# Patient Record
Sex: Male | Born: 1940 | Race: White | Hispanic: No | Marital: Single | Smoking: Former smoker
Health system: Southern US, Community
[De-identification: ages and names within clinical notes are randomized; demographics above are authoritative.]

## PROBLEM LIST (undated history)

## (undated) DIAGNOSIS — F99 Mental disorder, not otherwise specified: Secondary | ICD-10-CM

## (undated) DIAGNOSIS — K759 Inflammatory liver disease, unspecified: Secondary | ICD-10-CM

## (undated) DIAGNOSIS — I2510 Atherosclerotic heart disease of native coronary artery without angina pectoris: Secondary | ICD-10-CM

## (undated) DIAGNOSIS — F4310 Post-traumatic stress disorder, unspecified: Secondary | ICD-10-CM

## (undated) DIAGNOSIS — I639 Cerebral infarction, unspecified: Secondary | ICD-10-CM

## (undated) HISTORY — PX: TONSILLECTOMY: SUR1361

## (undated) HISTORY — PX: JOINT REPLACEMENT: SHX530

## (undated) HISTORY — DX: Atherosclerotic heart disease of native coronary artery without angina pectoris: I25.10

## (undated) HISTORY — DX: Cerebral infarction, unspecified: I63.9

## (undated) HISTORY — PX: KNEE SURGERY: SHX244

## (undated) HISTORY — PX: APPENDECTOMY: SHX54

---

## 2008-07-23 ENCOUNTER — Encounter: Admission: RE | Admit: 2008-07-23 | Discharge: 2008-07-23 | Payer: Self-pay | Admitting: Gastroenterology

## 2010-06-07 ENCOUNTER — Encounter: Admission: RE | Admit: 2010-06-07 | Discharge: 2010-06-07 | Payer: Self-pay | Admitting: Family Medicine

## 2011-05-12 NOTE — H&P (Signed)
NAME:  [name] NO.:  [id number]  MEDICAL RECORD NO.:  [id number]  LOCATION:  1S                           FACILITY:  [facility]  PHYSICIAN:  Madlyn Frankel. Charlann Boxer, M.D.  DATE OF BIRTH:  20-Jan-1941  DATE OF ADMISSION:  04/23/2011 DATE OF DISCHARGE:                             HISTORY & PHYSICAL   DATE OF SURGERY:  May 21, 2011  IMPRESSION:  Bilateral knee osteoarthritis.  HISTORY OF PRESENT ILLNESS:  The patient is a 70 year old white gentleman who was complaining of bilateral knee pain since 1996.  The patient states it has just been increasing over time, started off minimally, treated conservatively, was fine over the years. Conservative treatments including steroid injections and hyaluronic acid injections have worked less than less and the patient feels it is time to have a more definitive treatment.  X-rays in the clinic showed bilateral knee osteoarthritis, significant with bone-on-bone changes. Options were discussed with the patient.  The patient wishes to proceed with surgery.  Risks, benefits, and expectations of the procedure were discussed with the patient.  The patient understands the risks, benefits, and expectations, and wishes to proceed with surgery. Questions were invited and answered.  The patient is a candidate for tranexamic acid and will be given this perioperatively.  The patient has not been given his postop medications which will have to be given before discharge.  PAST MEDICAL HISTORY:  The patient's primary care physician is Dr. Allena Katz, at [facility] who has cleared the patient for surgery. Medical problems include : 1. Hepatitis. 2. Liver disease. 3. Degenerative disk disease. 4. Dentures. 5. Impaired vision and he wears glasses.  PAST SURGICAL HISTORY: 1. Hernia repair. 2. Tonsillectomy and adenoidectomy. 3. Appendectomy.  MEDICATIONS: 1. Klonopin 1 mg 1-1/2 tablet p.o. q.h.s. 2. Dulcolax q.h.s. 3. Ranitidine 150 mg 1 p.o.  daily.  ALLERGIES:  Iodine causes anaphylaxis.  The patient also states that he may be allergic to penicillin but he is not sure.  SOCIAL HISTORY:  The patient denies use of alcohol or tobacco.  REVIEW OF SYSTEMS:  The patient admits to frequent urination at night, back pain, morning stiffness and joint swelling, other than that unremarkable.  PHYSICAL EXAMINATION:  GENERAL:  The patient is a 70 year old white male, in no acute distress. VITAL SIGNS:  Stable.  Blood pressure is 138/84, respirations 16, pulse 80. HEENT:  Pupils are equal, round, and reactive to light and accommodation.  Throat is clear. NECK:  Supple.  No JVD.  No carotid bruits.  No lymphadenopathy. CARDIO:  Normal appearing S1 and S2.  No murmur appreciated. RESPIRATORY:  Lungs clear to auscultation bilaterally. NEUROLOGIC:  The patient is oriented x3. ORTHO:  Pertaining to the bilateral knees.  The patient does have slight tenderness to palpation of the anterior tibial surfaces of bilateral knees.  The patient does have significant crepitus.  With range of motion, the patient does have good extension and flexion of both knees. Surgical site was clear.  The patient has a +2 dorsalis pedis pulse, both legs.  The patient has good sensation distally, both legs.  IMPRESSION:  Bilateral knee osteoarthritis.  PLAN:  The patient will be admitted to  the hospital and undergo bilateral total knee replacements by Dr. Charlann Boxer on May 21, 2011. Risks, benefits and expectations of procedure were discussed with the patient.  The patient understands the risks, benefits, and expectations, and wishes to proceed with surgery.    ______________________________ Lanney Gins, PA   ______________________________ Madlyn Frankel. Charlann Boxer, M.D.    MB/MEDQ  D:  05/09/2011  T:  05/10/2011  Job:  [id number]  Electronically Signed by Lanney Gins PA on 05/10/2011 03:58:44 PM Electronically Signed by Durene Romans M.D. on 05/12/2011  09:21:55 AM

## 2011-05-14 ENCOUNTER — Other Ambulatory Visit: Payer: Self-pay | Admitting: Orthopedic Surgery

## 2011-05-14 ENCOUNTER — Encounter (HOSPITAL_COMMUNITY): Payer: Medicare PPO

## 2011-05-14 LAB — COMPREHENSIVE METABOLIC PANEL
AST: 21 U/L (ref 0–37)
Albumin: 4.3 g/dL (ref 3.5–5.2)
Alkaline Phosphatase: 41 U/L (ref 39–117)
BUN: 13 mg/dL (ref 6–23)
GFR calc non Af Amer: >60 mL/min (ref >60)
Sodium: 136 mEq/L (ref 135–145)
Total Protein: 7.6 g/dL (ref 6.0–8.3)

## 2011-05-14 LAB — DIFFERENTIAL
Basophils Absolute: 0 K/uL (ref 0.0–0.1)
Basophils Relative: 1 % (ref 0–1)
Eosinophils Absolute: 0.3 K/uL (ref 0.0–0.7)
Eosinophils Relative: 5 % (ref 0–5)
Lymphocytes Relative: 34 % (ref 12–46)
Lymphs Abs: 2.2 K/uL (ref 0.7–4.0)
Monocytes Absolute: 0.6 K/uL (ref 0.1–1.0)
Monocytes Relative: 9 % (ref 3–12)
Neutro Abs: 3.3 K/uL (ref 1.7–7.7)
Neutrophils Relative %: 52 % (ref 43–77)

## 2011-05-14 LAB — PROTIME-INR
INR: 0.96 (ref 0.00–1.49)
Prothrombin Time: 13 seconds (ref 11.6–15.2)

## 2011-05-14 LAB — URINALYSIS, ROUTINE W REFLEX MICROSCOPIC
Bilirubin Urine: NEGATIVE
Glucose, UA: NEGATIVE mg/dL
Hgb urine dipstick: NEGATIVE
Ketones, ur: NEGATIVE mg/dL
Leukocytes, UA: NEGATIVE
Nitrite: NEGATIVE
Protein, ur: NEGATIVE mg/dL
Specific Gravity, Urine: 1.013 (ref 1.005–1.030)
Urobilinogen, UA: 0.2 mg/dL (ref 0.0–1.0)
pH: 7 (ref 5.0–8.0)

## 2011-05-14 LAB — CBC
MCV: 92.7 fL (ref 78.0–100.0)
Platelets: 235 10*3/uL (ref 150–400)
RDW: 12.7 % (ref 11.5–15.5)
WBC: 6.4 10*3/uL (ref 4.0–10.5)

## 2011-05-14 LAB — SURGICAL PCR SCREEN
MRSA, PCR: NEGATIVE
Staphylococcus aureus: POSITIVE — AB

## 2011-05-14 LAB — APTT: aPTT: 32 seconds (ref 24–37)

## 2011-05-21 ENCOUNTER — Ambulatory Visit (HOSPITAL_COMMUNITY)
Admission: RE | Admit: 2011-05-21 | Discharge: 2011-05-21 | Disposition: A | Discharge status: Home / Self Care | Payer: Medicare PPO | Source: Ambulatory Visit | Attending: Orthopedic Surgery | Admitting: Orthopedic Surgery

## 2011-05-21 DIAGNOSIS — IMO0002 Reserved for concepts with insufficient information to code with codable children: Secondary | ICD-10-CM | POA: Insufficient documentation

## 2011-05-21 DIAGNOSIS — B1920 Unspecified viral hepatitis C without hepatic coma: Secondary | ICD-10-CM | POA: Insufficient documentation

## 2011-05-21 DIAGNOSIS — M1710 Unilateral primary osteoarthritis, unspecified knee: Secondary | ICD-10-CM | POA: Insufficient documentation

## 2011-05-21 DIAGNOSIS — K7689 Other specified diseases of liver: Secondary | ICD-10-CM | POA: Insufficient documentation

## 2011-05-21 DIAGNOSIS — Z79899 Other long term (current) drug therapy: Secondary | ICD-10-CM | POA: Insufficient documentation

## 2011-07-04 ENCOUNTER — Other Ambulatory Visit: Payer: Self-pay | Admitting: Orthopedic Surgery

## 2011-07-04 ENCOUNTER — Encounter (HOSPITAL_COMMUNITY): Payer: Medicare PPO

## 2011-07-04 LAB — CBC
HCT: 44 % (ref 39.0–52.0)
MCH: 33.3 pg (ref 26.0–34.0)
MCHC: 35.5 g/dL (ref 30.0–36.0)
MCV: 93.8 fL (ref 78.0–100.0)
Platelets: 235 10*3/uL (ref 150–400)
RBC: 4.69 MIL/uL (ref 4.22–5.81)
WBC: 6.3 10*3/uL (ref 4.0–10.5)

## 2011-07-04 LAB — URINALYSIS, ROUTINE W REFLEX MICROSCOPIC
Leukocytes, UA: NEGATIVE
Specific Gravity, Urine: 1.005 (ref 1.005–1.030)

## 2011-07-04 LAB — DIFFERENTIAL
Basophils Absolute: 0 10*3/uL (ref 0.0–0.1)
Basophils Relative: 0 % (ref 0–1)
Eosinophils Relative: 2 % (ref 0–5)
Monocytes Relative: 9 % (ref 3–12)

## 2011-07-04 LAB — PROTIME-INR
INR: 0.95 (ref 0.00–1.49)
Prothrombin Time: 12.9 seconds (ref 11.6–15.2)

## 2011-07-04 LAB — COMPREHENSIVE METABOLIC PANEL
AST: 22 U/L (ref 0–37)
Calcium: 9.8 mg/dL (ref 8.4–10.5)
Chloride: 102 mEq/L (ref 96–112)
GFR calc Af Amer: >60 mL/min (ref >60)
Total Bilirubin: 0.3 mg/dL (ref 0.3–1.2)

## 2011-07-04 LAB — APTT: aPTT: 33 seconds (ref 24–37)

## 2011-07-07 LAB — MRSA CULTURE

## 2011-07-09 ENCOUNTER — Inpatient Hospital Stay (HOSPITAL_COMMUNITY)
Admission: RE | Admit: 2011-07-09 | Discharge: 2011-07-12 | DRG: 462 | Disposition: A | Discharge status: Skilled Nursing Facility | Payer: Medicare PPO | Source: Ambulatory Visit | Attending: Orthopedic Surgery | Admitting: Orthopedic Surgery

## 2011-07-09 DIAGNOSIS — Z880 Allergy status to penicillin: Secondary | ICD-10-CM

## 2011-07-09 DIAGNOSIS — Z79899 Other long term (current) drug therapy: Secondary | ICD-10-CM

## 2011-07-09 DIAGNOSIS — Z01812 Encounter for preprocedural laboratory examination: Secondary | ICD-10-CM

## 2011-07-09 DIAGNOSIS — IMO0002 Reserved for concepts with insufficient information to code with codable children: Secondary | ICD-10-CM | POA: Diagnosis present

## 2011-07-09 DIAGNOSIS — Z87891 Personal history of nicotine dependence: Secondary | ICD-10-CM

## 2011-07-09 DIAGNOSIS — H547 Unspecified visual loss: Secondary | ICD-10-CM | POA: Diagnosis present

## 2011-07-09 DIAGNOSIS — Z8619 Personal history of other infectious and parasitic diseases: Secondary | ICD-10-CM

## 2011-07-09 DIAGNOSIS — M1710 Unilateral primary osteoarthritis, unspecified knee: Principal | ICD-10-CM | POA: Diagnosis present

## 2011-07-09 LAB — TYPE AND SCREEN: Antibody Screen: NEGATIVE

## 2011-07-09 NOTE — H&P (Addendum)
NAMEVERDIS, KOVAL NO.:  [id number]  MEDICAL RECORD NO.:  [id number]  LOCATION:                               FACILITY:  [facility]  PHYSICIAN:  Madlyn Frankel. Charlann Boxer, M.D.  DATE OF BIRTH:  Nov 03, 1940  DATE OF ADMISSION:  07/09/2011 DATE OF DISCHARGE:                             HISTORY & PHYSICAL   DATE OF SURGERY:  07/09/2011  DIAGNOSIS:  Bilateral knee pain/osteoarthritis.  HISTORY OF PRESENT ILLNESS:  The patient is a 70 year old white male in no acute distress.  The patient does state he has had bilateral knee pain since 1996 has been increasing since that time.  The patient has had steroid injections and hyaluronic acid injections both of which helped at first.  They became less and less effective over the years until as of late they have been noneffective.  Bilateral knee x-rays do show significant osteoarthritic changes both knees.  Questions were invited and answered.  Options were discussed.  Risks, benefits, and expectations of procedure discussed with the patient.  The patient stands risks, benefits, and expectations and wishes to proceed with bilateral total knee arthroplasties per Dr. Charlann Boxer.  Once being discharged from the hospital, the patient is planning going to a rehab facility.  The patient has not been given his postoperative medications.  The patient is a candidate for tranexamic acid and will be given this prior to surgery.  PAST MEDICAL HISTORY: 1. Liver disease. 2. Hepatitis. 3. Degenerative disk disease. 4. Impaired vision with glasses. 5. Dentures.  PAST SURGICAL HISTORY: 1. Hernia repair. 2. T and A. 3. Appendectomy.  MEDICATIONS: 1. Klonopin 1 mg 1-1/2 tablets q.h.s. 2. Dulcolax 2 p.o. q.h.s. 3. Omeprazole, unknown dose one p.o. q.h.s.Marland Kitchen  ALLERGIES: 1. IODINE causes anaphylaxis reaction. 2. PENICILLIN, unknown reaction.  PRIMARY CARE PHYSICIAN:  Dr. Allena Katz, who has cleared the patient for surgery.  SOCIAL HISTORY:  The  patient admits to smoking in the past, but not at this current time.  The patient denies use of alcohol.  REVIEW OF SYSTEMS:  The patient complains of bilateral knee pain, otherwise unremarkable.  PHYSICAL EXAMINATION:  GENERAL:  The patient is a 70 year old white male in no acute distress. VITAL SIGNS:  Stable.  Blood pressure in the left arm is 132/80, pulse 80, respirations 16. HEENT:  Pupils are equal, round, reactive to light and accommodation. Throat is clear. NECK:  Supple.  No JVD.  No carotid bruits noted.  No lymphadenopathy noted. CARDIAC:  Normal S1 and S2.  No murmur appreciated. RESPIRATORY:  Lungs are clear to auscultation bilaterally. NEURO:  The patient is alert and oriented x3. ORTHO:  Pertaining to bilateral knees.  The patient has no pain on palpation of the entirety of both knees.  The patient does have significant retropatellar crepitus bilaterally.  The patient has good range of motion, but this does cause him pain with whole range of motion and no significant crepitus.  Also, the patient is distally and neurovascularly intact bilaterally as opposed to dorsalis pedis pulse bilaterally.  Surgical sites in both knees are both clear.  STUDIES:  X-rays as above.  IMPRESSION:  Bilateral knee osteoarthritis.  PLAN:  The patient  is admitted to the hospital to undergo bilateral total knee arthroplasties per Dr. Charlann Boxer at [facility] on July 09, 2011.  Risks, benefits, and expectations of the procedure were discussed with the patient.  The patient understands the risks, benefits, and expectations and wished to proceed with surgery.    ______________________________ Lanney Gins, PA   ______________________________ Madlyn Frankel. Charlann Boxer, M.D.    MB/MEDQ  D:  07/04/2011  T:  07/05/2011  Job:  [id number]  Electronically Signed by Lanney Gins PA on 07/07/2011 02:43:42 AM Electronically Signed by Durene Romans M.D. on 07/09/2011 09:04:52 AM

## 2011-07-10 LAB — CBC
MCV: 92.8 fL (ref 78.0–100.0)
Platelets: 180 10*3/uL (ref 150–400)
RBC: 3.62 MIL/uL — ABNORMAL LOW (ref 4.22–5.81)
RDW: 13.2 % (ref 11.5–15.5)
WBC: 6.3 10*3/uL (ref 4.0–10.5)

## 2011-07-10 LAB — BASIC METABOLIC PANEL
CO2: 27 mEq/L (ref 19–32)
Creatinine, Ser: 0.71 mg/dL (ref 0.50–1.35)
GFR calc Af Amer: >90 mL/min (ref >90)
GFR calc non Af Amer: >90 mL/min (ref >90)
Potassium: 4.4 mEq/L (ref 3.5–5.1)
Sodium: 133 mEq/L — ABNORMAL LOW (ref 135–145)

## 2011-07-11 LAB — CBC
HCT: 31 % — ABNORMAL LOW (ref 39.0–52.0)
MCH: 33.1 pg (ref 26.0–34.0)
MCHC: 35.5 g/dL (ref 30.0–36.0)
MCV: 93.4 fL (ref 78.0–100.0)
RBC: 3.32 MIL/uL — ABNORMAL LOW (ref 4.22–5.81)
RDW: 13.2 % (ref 11.5–15.5)
WBC: 6 10*3/uL (ref 4.0–10.5)

## 2011-07-11 LAB — BASIC METABOLIC PANEL
Chloride: 102 mEq/L (ref 96–112)
Glucose, Bld: 119 mg/dL — ABNORMAL HIGH (ref 70–99)
Potassium: 3.6 mEq/L (ref 3.5–5.1)
Sodium: 134 mEq/L — ABNORMAL LOW (ref 135–145)

## 2011-07-12 NOTE — Op Note (Signed)
[name], [name] NO.:  [id number]  MEDICAL RECORD NO.:  [id number]  LOCATION:  1610                         FACILITY:  [facility]  PHYSICIAN:  [name], M.D.  DATE OF BIRTH:  1941/04/19  DATE OF PROCEDURE:  07/09/2011 DATE OF DISCHARGE:                              OPERATIVE REPORT   PREOPERATIVE DIAGNOSIS:  Bilateral knee osteoarthritis.  POSTOPERATIVE DIAGNOSIS:  Bilateral knee osteoarthritis.  FINDINGS:  The patient was noted to have bone-on-bone changes noted within his medial and patellofemoral parts of his knees bilaterally.  PROCEDURE:  Bilateral total knee replacement.  COMPONENTS USED:  DePuy rotating platform posterior stabilized knee system on the right knee with a size 5 femur, 4 tibia, 12.5 insert, and a 41 patellar button.  The left knee with a 5 femur, 4 tibia, and a 10 mm insert with 41 button.  SURGEON:  [name], M.D.  ASSISTANT:  John Gins, PA  ANESTHESIA:  Epidural plus general anesthetic.  SPECIMENS:  None.  COMPLICATIONS:  None.  ESTIMATED BLOOD LOSS:  200 cc.  COMBINED DRAINS:  One Hemovac per knee or two total.  TOTAL TOURNIQUET TIME:  On the right knee was 36 minutes at 250 mmHg. The left knee was 35 minutes at 250 mmHg.  INDICATION FOR PROCEDURE:  [name] is a 70 year old male, who is a patient of mine for sometime following monitoring him for knee arthritis changes.  He has had progressive worsening of symptoms after multiple rounds of cortisone and viscus supplementation.  He at this point was felt his quality of life is diminished and he is ready to proceed with a more definitive option.  Risks and benefits were discussed in terms of the treatment options.  We discussed simultaneous knee replacement versus stage.  After lengthy discussion of mine all this, we decided on doing a simultaneous bilateral total knee replacement.  Consent was obtained for the benefit of pain relief.  PROCEDURE IN  DETAIL:  The patient was brought to operative theater. Once adequate anesthesia and preoperative antibiotics, clindamycin administered, the patient was positioned in supine.  Bilateral thigh tourniquets were placed.  He was positioned into [facility] leg holders.  The time-out was performed identifying the patient, planned procedures and the extremities.  We were proceeding with a left knee first and did a separate time-out for the left knee and the right knee.  The left lower extremity was then exsanguinated, tourniquet elevated to 250 mmHg.  A midline incision was made followed by median and parapatellar arthrotomy.  Following initial exposure and synovial debridement attention was first directed to patella, precut measurement of the patella was noted be about 26 mm.  I resected down to about 14 mm.  Lug holes were drilled for 41 patellar button and a metal shim placed to protect the patella from retractors and saw blades.  Attention was now directed to the femur.  Femoral canal was opened and drill irrigated to try to prevent fat emboli.  An intramedullary rod was passed at 5 degrees valgus, 11 mm of bone resected off the distal femur following this resection the tibia was subluxated anteriorly.  The use of an extramedullary guide,  I made a measured resection of 10 mm off the proximal lateral tibia.  Following this resection, we confirmed the gap, was stable to 10 mm insert, intact medial and lateral collateral ligaments as well as confirmed that the cut was perpendicular in the coronal plane.  Once this was done, I sized the femur to be a size 5 from an anterior- posterior mentioned the size 5 rotation block was pinned into position drill using the anterior branch referenced off the tibia and then using a C-clamp set rotation.  The 4-in-1 cutting block then pinned into position confirmed to be no notching.  Anterior-posterior chamfer cuts were then made without difficulty nor  notching.  Final box cut was made off the lateral aspect of distal femur. Following this resection, attention was directed down to the tibia and the tibia subluxated anteriorly.  The cut surfacing to be best fit with size, 4 tibial tray, appended to the medial third of the tubercle, drilled and keel punched.  A trial reduction was now carried out with a 5 femur, 4 tibia, and a 10 mm insert.  With this, the knee was found to come to full extension.  The patella was tracked through the trochlea without application of pressure.  Given these findings, the trial components removed, the synovial capsule junction of the knee was injected with 30 cc of 0.25% Marcaine with epinephrine and 1 cc of Toradol.  Final components were opened holding the polyethylene insert.  The knee was irrigated with normal saline and solution pulse lavage.  The final components were cemented onto clean and dried cut surfaces of bone.  The knee was brought to extension with a 10 mm insert and extruded cement was removed.  Once the cement had fully cured, an excessive cement was removed.  I chose a 10 mm insert and placed it onto the tibial tray.  At this point, tourniquet was let down after 35 minutes.  No significant hemostasis required, medium Hemovac drain was placed deep.  The knee was re-irrigated with normal saline and solution.  At this point, the extensor mechanism was reapproximated with #1 Vicryl, the knee in flexion.  At this point, I attended to the right knee while [name], my physician assistant, went ahead and closed the skin and the subcuticular layer with 2-0 Vicryl and 4-0 respectively.  The knee was preliminary dressed as we were proceeding with the right knee.  The right knee procedure was nearly identical.  The right lower extremity, a time-out was performed identifying that it is a separate procedure.  Once this was done, the leg was exsanguinated, tourniquet elevated to 250 mmHg.   A midline incision was made again here.  Following initial exposure and debridement of paramedian and parapatellar arthrotomy, attention was directed to the patella.  Precut measurement again was about 26 mm.  I resected down to a 14 mm, 41 patellar button was again used, lug holes were drilled and a metal shim placed.  The attention was now directed to femur.  The femoral canal was opened and drill irrigated to try to prevent fat emboli.  Intramedullary rod was passed and a 5 degrees valgus, again a 11 mm bone resected off the distal femur.  Following this resection, the attention was directed to the tibia subluxated anteriorly, cruciates and meniscus were debrided using extramedullary guide, 10 mm resection off the proximal tibia was carried out based off the lateral side.  Again, we confirmed the gap be stable and need to go  up to 12.5 insert, however, on this cut after these cuts.  This femur was sized again and it turned to be a size 5, the size 5 rotation block again was pinned into position, anterior reference using a C clamp set rotation as I had confirmed this cut was perpendicular in the coronal plane.  Following this resection, the final box cut made off the lateral aspect of distal femur.  The tibia was then subluxated again and the tibia tray was again determined be a size 4.  A size 4 block was then pinned into position.  I drilled and keel punched, medial osteophytes removed.  The trial reduction again was carried out with a 5 femur, 4 tibia and at this point 12.5 insert.  With this, the knee came to full extension. The patella tracked through the trochlea without application pressure.  Given this trial reduction, the trial components were removed.  The knee was injected with 30 cc of 0.25% Marcaine with epinephrine 1 cc of Toradol.  The final components were opened holding the polyethylene insert.  The knee was irrigated with normal saline solution.  The final  components were then cemented onto clean and dried cut surfaces of bone.  The knee was brought to extension with a 12.5 insert. With this the knee came to full extension, extruded cement was removed. Once cement had fully cured, excessive cement was removed throughout the knee.  The final 12.5 insert was chosen.  This insert was then placed into the knee.  The tourniquet had been let down after 36 minutes on his right knee.  I re-irrigated the knee and normal saline solution placed and a medium Hemovac drain.  Deep extensor mechanism on this knee was then reapproximated using #1 Vicryl and the knee in flexion and remainder of the knee was then reapproximated with 2-0 Vicryl and running 4-0 Monocryl.  Both knees were then cleaned, dried, and dressed sterilely with Dermabond and Aquacel dressing, then loosely wrapped with Ace wrap.  He was then brought to recovery room in stable condition, extubated tolerating the procedure.     John Frankel Charlann Glass, M.D.     MDO/MEDQ  D:  07/09/2011  T:  07/10/2011  Job:  [id number]  Electronically Signed by Durene Romans M.D. on 07/12/2011 08:18:08 AM

## 2011-07-12 NOTE — Op Note (Signed)
  NAMEKYLLE, LALL NO.:  [id number]  MEDICAL RECORD NO.:  [id number]  LOCATION:  1610                         FACILITY:  [facility]  PHYSICIAN:  Madlyn Frankel. Charlann Boxer, M.D.  DATE OF BIRTH:  13-May-1941  DATE OF PROCEDURE:  07/09/2011 DATE OF DISCHARGE:                              OPERATIVE REPORT   ADDENDUM: Physician assistant, Lanney Gins, was present for the entirety of both cases, involved with preoperative positioning, perioperative retractor management, and wound closure for both knees.  He was thus significantly involved with general facilitation of the procedures.     Madlyn Frankel Charlann Boxer, M.D.     MDO/MEDQ  D:  07/09/2011  T:  07/10/2011  Job:  [id number]  Electronically Signed by Durene Romans M.D. on 07/12/2011 08:18:17 AM

## 2011-08-06 NOTE — Discharge Summary (Signed)
NAMEJOANN, KULPA NO.:  [id number]  MEDICAL RECORD NO.:  [id number]  LOCATION:  1610                         FACILITY:  [facility]  PHYSICIAN:  Lanney Gins, PA     DATE OF BIRTH:  08-21-1941  DATE OF ADMISSION:  07/09/2011 DATE OF DISCHARGE:  07/12/2011                        DISCHARGE SUMMARY - REFERRING   PROCEDURE:  Bilateral total knee arthroplasties.  HISTORY OF PRESENT ILLNESS:  The patient is a 70 year old white male in no acute distress.  The patient does state that he has had bilateral knee pain since about 1996 and has been increasing since that time.  The patient has had steroid injections and hyaluronic acid injections, both of which helped at first, that became less and less effective over the years until as of late they have been noneffective in controlling any symptoms.  Bilateral knee x-rays do show significant arthritic changes of both knees.  Various options were discussed with the patient.  The patient wishes to proceed with surgery.  Risks, benefits, and expectation of procedure were discussed with the patient.  The patient understands the risks, benefits, and expectations and wishes to proceed with bilateral total knee arthroplasties per Dr. Charlann Boxer.  HOSPITAL COURSE:  The patient underwent the above stated procedure on July 09, 2011.  The patient tolerated the procedure well, was brought to the recovery room in good condition and subsequently to the floor.  Postop day #1, July 10, 2011, the patient was doing well, no events. Pain was well controlled with the epidural.  He was afebrile.  Vital signs stable.  H and H was 12.2/33.6.  The patient has good sensation to light touch bilateral left leg, but he did feel some numbness from the epidural.  Dressings were good, clean, dry, and intact.  The patient had physical therapy.  The patient's Hemovac drain was removed.  Postop day #2, July 11, 2011, the patient was doing well, no  events. Pain was well controlled with the epidural, though there was again some stiffness in his hips, temperature was 99.1.  H and H 11.0/31.0. Dressings were good, clean, dry, and intact.  The patient does state he still has a little bit numbness from the epidural.  This was discontinued later in the day.  Started on p.o. medication.  The patient had physical therapy.  Postop day #3, July 12, 2011, the patient was doing well, no events. Pain was well controlled with oral medications, 99.2 was his temperature, vital signs were stable.  No new labs were done.  He was distally neurovascularly intact.  Dressings were good, clean, dry, and intact.  The patient on physical therapy.  It was felt that the patient was being well enough to be discharged to [facility] after having physical therapy in the hospital.  DISCHARGE CONDITION:  Good.  DISCHARGE INSTRUCTION:  The patient will be discharged to [facility] on July 12, 2011.  The patient will be weightbearing as tolerated. The patient should maintain a surgical dressing for about 7 to 8 days, after which time we will replace with gauze and tape.  The patient will keep the area dry and clean until followup.  The patient will follow  up in 2 weeks at [facility].  The patient is to call with any questions or concerns.  DISCHARGE MEDICATIONS: 1. Tylenol 325 mg 1 to 2 p.o. q.4 hours p.r.n. pain. 2. Benadryl 25 mg 1 p.o. q.4 hours p.r.n. 3. Colace 100 mg 1 p.o. b.i.d. constipation. 4. Iron sulfate 325 mg 1 p.o. t.i.d. x2 to 3 weeks. 5. Robaxin 500 mg one p.o. q.6 hours p.r.n. muscle spasm. 6. Oxycodone 5 mg one to two p.o. q.4-6 hours p.r.n. pain. 7. MiraLax 17 grams p.o. q. day constipation. 8. Xarelto 10 mg p.o. q. day x14 days. 9. Calcium carbonate/vitamin D one q. day. 10.Clonazepam 1 mg one half p.o. q.h.s. 11.Multivitamin daily. 12.Omeprazole 20 mg one p.o. q.h.s. 13.Ranitidine 150 mg one p.o. p.r.n.  heartburn.          ______________________________ Lanney Gins, PA     MB/MEDQ  D:  07/12/2011  T:  07/12/2011  Job:  [id number]  Electronically Signed by Lanney Gins PA on 07/24/2011 08:28:57 AM Electronically Signed by Durene Romans M.D. on 08/06/2011 09:14:53 AM

## 2011-08-27 ENCOUNTER — Emergency Department (HOSPITAL_COMMUNITY): Payer: Medicare PPO

## 2011-08-27 ENCOUNTER — Encounter: Payer: Self-pay | Admitting: *Deleted

## 2011-08-27 ENCOUNTER — Emergency Department (HOSPITAL_COMMUNITY)
Admission: EM | Admit: 2011-08-27 | Discharge: 2011-08-27 | Disposition: A | Discharge status: Home / Self Care | Payer: Medicare PPO | Attending: Emergency Medicine | Admitting: Emergency Medicine

## 2011-08-27 DIAGNOSIS — M25476 Effusion, unspecified foot: Secondary | ICD-10-CM | POA: Insufficient documentation

## 2011-08-27 DIAGNOSIS — L03115 Cellulitis of right lower limb: Secondary | ICD-10-CM

## 2011-08-27 DIAGNOSIS — Z96659 Presence of unspecified artificial knee joint: Secondary | ICD-10-CM | POA: Insufficient documentation

## 2011-08-27 DIAGNOSIS — L02619 Cutaneous abscess of unspecified foot: Secondary | ICD-10-CM | POA: Insufficient documentation

## 2011-08-27 DIAGNOSIS — M25473 Effusion, unspecified ankle: Secondary | ICD-10-CM | POA: Insufficient documentation

## 2011-08-27 DIAGNOSIS — M25579 Pain in unspecified ankle and joints of unspecified foot: Secondary | ICD-10-CM | POA: Insufficient documentation

## 2011-08-27 MED ORDER — OXYCODONE HCL 5 MG PO TABS: 5.0000 mg | ORAL_TABLET | ORAL | Status: DC | PRN

## 2011-08-27 MED ORDER — DOXYCYCLINE HYCLATE 100 MG PO CAPS: 100.0000 mg | ORAL_CAPSULE | Freq: Two times a day (BID) | ORAL | Status: AC

## 2011-08-27 MED ORDER — OXYCODONE-ACETAMINOPHEN 5-325 MG PO TABS: 1.0000 | ORAL_TABLET | ORAL | Status: AC | PRN

## 2011-08-27 MED ORDER — OXYCODONE-ACETAMINOPHEN 5-325 MG PO TABS: 1.0000 | ORAL_TABLET | ORAL | Status: DC | PRN

## 2011-08-27 MED ORDER — HYDROMORPHONE HCL PF 1 MG/ML IJ SOLN
1.0000 mg | Freq: Once | INTRAMUSCULAR | Status: AC
  Administered 2011-08-27: 1 mg via INTRAMUSCULAR

## 2011-08-27 NOTE — ED Notes (Signed)
PA at bedside to eval pt

## 2011-08-27 NOTE — ED Notes (Signed)
Returned from xray

## 2011-08-27 NOTE — ED Provider Notes (Signed)
History     CSN: [id number] Arrival date & time: 08/27/2011 10:57 AM   First MD Initiated Contact with Patient 08/27/11 1132      Chief Complaint  Patient presents with  . Ankle Pain    possible broken ankle    (Consider location/radiation/quality/duration/timing/severity/associated sxs/prior treatment) HPI History provided by pt.   Pt stepped on a piece of glass with right foot 21 days ago.  Developed pain, erythema and edema of foot the following day.  Dr. Charlann Boxer examined, was not convinced that he had cellulitis but prescribed Keflex.  Pt compliant w/ abx.  Erythema has spread up leg and pain has worsened.  Pain aggravated by bearing weight.  Denies fever.  Would like an xray and a WBC count ordered.  Immunocompetent.   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   History reviewed. No pertinent past medical history.  Past Surgical History  Procedure Date  . Knee surgery     History reviewed. No pertinent family history.  History  Substance Use Topics  . Smoking status: Never Smoker   . Smokeless tobacco: Never Used  . Alcohol Use: No      Review of Systems  All other systems reviewed and are negative.    Allergies  Iohexol  Home Medications   Current Outpatient Rx  Name Route Sig Dispense Refill  . VITAMIN C PO Oral Take 1 tablet by mouth daily.      Marland Kitchen CALCIUM + D PO Oral Take 1 tablet by mouth daily.      . CEPHALEXIN 500 MG PO CAPS Oral Take 500 mg by mouth 4 (four) times daily. Patient completed 15 day therapy course on 08/24/2011     . VITAMIN D PO Oral Take 1 tablet by mouth daily.      Marland Kitchen CLONAZEPAM 1 MG PO TABS Oral Take 1.5 mg by mouth at bedtime.      Marland Kitchen COENZYME Q-10 PO Oral Take 1 capsule by mouth daily.      Marland Kitchen VITAMIN B-12 PO Oral Take 1 tablet by mouth daily.      Marland Kitchen MAGNESIUM OXIDE PO Oral Take 1 tablet by mouth daily.      Carma Leaven M PLUS PO TABS Oral Take 1 tablet by mouth daily.      Marland Kitchen RANITIDINE HCL  150 MG PO TABS Oral Take 150 mg by mouth daily as needed. For acid reflux     . VITAMIN B-2 PO Oral Take 1 tablet by mouth daily.      Marland Kitchen VITAMIN A PO Oral Take 1 capsule by mouth daily.        BP 132/74  Pulse 93  Temp(Src) 98 F (36.7 C) (Oral)  Resp 18  Wt 196 lb (88.905 kg)  SpO2 100%  Physical Exam  Nursing note and vitals reviewed. Constitutional: He is oriented to person, place, and time. He appears well-developed and well-nourished. No distress.  HENT:  Head: Normocephalic and atraumatic.  Eyes:       Normal appearance  Neck: Normal range of motion.  Musculoskeletal:       Right foot edematous and mildly erythematous compared to left.  Ttp.  Poorly demarcated erythema of right anterror lower leg, but appears the same as the left.  Tenderness of lower leg anteriorly only and no lower leg edema.  Full active ROM ankles and toes.  2+ DP pulse.  Nml sensation in toes.    Neurological:  He is alert and oriented to person, place, and time.  Psychiatric: He has a normal mood and affect. His behavior is normal.    ED Course  Procedures (including critical care time)  Labs Reviewed - No data to display Dg Ankle Complete Right  08/27/2011  *RADIOLOGY REPORT*  Clinical Data: Right foot and ankle pain.  RIGHT ANKLE - COMPLETE 3+ VIEW  Comparison: None.  Findings: There is a small ankle joint effusion.  There are old avulsions from the medial and lateral malleoli with spurring at the ankle joint consistent with osteoarthritis.  Arteriovascular calcifications are seen in the soft tissues of the ankle and foot. No acute osseous abnormality.  IMPRESSION:  1.  Small ankle joint effusion. 2.  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  Original Report Authenticated By: Gwynn Burly, M.D.   Dg Foot Complete Right  08/27/2011  *RADIOLOGY REPORT*  Clinical Data: Foot and ankle pain.  RIGHT FOOT COMPLETE - 3+ VIEW  Comparison: None.  Findings: Mild arthritic changes  are present at the first metatarsal phalangeal joint with spurring.  Slight bunion formation at the head of the first metatarsal.  Vascular calcifications are present in the foot.  Arthritic changes of the ankle joint.  Small ankle joint effusion.  No fracture or bone destruction.  IMPRESSION: Arthritic changes.  Small ankle effusion.  Original Report Authenticated By: Gwynn Burly, M.D.     1. Cellulitis of right foot       MDM  Pt presents non-traumatic edema/erythema/pain of right foot that started after cutting sole of foot w/ piece of glass.  Dr. Charlann Boxer prescribed keflex for possible cellulitis but pain increasing and erythema spreading up leg.  Doubt DVT b/c although pt had surgery 6wks ago, no other RF for blood clot, edema localized to foot, no lower leg edema or calf tenderness and erythema of anterior lower leg looks the same as the left lower leg.  Xray neg.  Pt likely has right foot cellulitis that failed outpatient therapy.    B/c he is immunocompetent and has close f/u, discharged home w/ doxycyline for better coverage as well as percocet for pain.   He declined cam walker.  Strict return precautions discussed.  I explained to pt why a WBC count was not indicated today.       Otilio Miu, [place] 08/27/11 2015

## 2011-08-27 NOTE — ED Notes (Signed)
Pt states he ankle pain started after his knee replacement. Pt states he went step on piece of glass in the shower and his ankle started. Pt went to pcp.

## 2011-08-30 NOTE — ED Provider Notes (Signed)
Medical screening examination/treatment/procedure(s) were performed by non-physician practitioner and as supervising physician I was immediately available for consultation/collaboration.   Forbes Cellar, MD 08/30/11 [phone]

## 2011-09-17 ENCOUNTER — Ambulatory Visit: Payer: Medicare PPO | Attending: Orthopedic Surgery | Admitting: Physical Therapy

## 2011-09-17 DIAGNOSIS — IMO0001 Reserved for inherently not codable concepts without codable children: Secondary | ICD-10-CM | POA: Insufficient documentation

## 2011-09-17 DIAGNOSIS — M25579 Pain in unspecified ankle and joints of unspecified foot: Secondary | ICD-10-CM | POA: Insufficient documentation

## 2011-09-17 DIAGNOSIS — M25676 Stiffness of unspecified foot, not elsewhere classified: Secondary | ICD-10-CM | POA: Insufficient documentation

## 2011-09-17 DIAGNOSIS — R262 Difficulty in walking, not elsewhere classified: Secondary | ICD-10-CM | POA: Insufficient documentation

## 2011-09-17 DIAGNOSIS — M25673 Stiffness of unspecified ankle, not elsewhere classified: Secondary | ICD-10-CM | POA: Insufficient documentation

## 2011-09-19 ENCOUNTER — Ambulatory Visit: Payer: Medicare PPO

## 2011-09-24 ENCOUNTER — Ambulatory Visit: Payer: Medicare PPO

## 2011-09-27 ENCOUNTER — Ambulatory Visit: Payer: Medicare PPO | Admitting: Physical Therapy

## 2011-10-04 ENCOUNTER — Encounter: Payer: Medicare PPO | Admitting: Physical Therapy

## 2011-10-08 ENCOUNTER — Ambulatory Visit: Payer: Medicare PPO | Admitting: Physical Therapy

## 2011-10-11 ENCOUNTER — Ambulatory Visit: Payer: Medicare PPO | Attending: Orthopedic Surgery | Admitting: Physical Therapy

## 2011-10-11 DIAGNOSIS — M25673 Stiffness of unspecified ankle, not elsewhere classified: Secondary | ICD-10-CM | POA: Insufficient documentation

## 2011-10-11 DIAGNOSIS — M25676 Stiffness of unspecified foot, not elsewhere classified: Secondary | ICD-10-CM | POA: Insufficient documentation

## 2011-10-11 DIAGNOSIS — M25579 Pain in unspecified ankle and joints of unspecified foot: Secondary | ICD-10-CM | POA: Insufficient documentation

## 2011-10-11 DIAGNOSIS — R262 Difficulty in walking, not elsewhere classified: Secondary | ICD-10-CM | POA: Insufficient documentation

## 2011-10-11 DIAGNOSIS — IMO0001 Reserved for inherently not codable concepts without codable children: Secondary | ICD-10-CM | POA: Insufficient documentation

## 2011-10-15 ENCOUNTER — Ambulatory Visit: Payer: Medicare PPO

## 2011-10-18 ENCOUNTER — Ambulatory Visit: Payer: Medicare PPO | Admitting: Physical Therapy

## 2011-10-22 ENCOUNTER — Ambulatory Visit: Payer: Medicare PPO | Admitting: Physical Therapy

## 2011-10-25 ENCOUNTER — Ambulatory Visit: Payer: Medicare PPO | Admitting: Physical Therapy

## 2012-06-25 ENCOUNTER — Other Ambulatory Visit: Payer: Self-pay | Admitting: Family Medicine

## 2012-06-25 ENCOUNTER — Ambulatory Visit
Admission: RE | Admit: 2012-06-25 | Discharge: 2012-06-25 | Disposition: A | Discharge status: Home / Self Care | Payer: Medicare PPO | Source: Ambulatory Visit | Attending: Family Medicine | Admitting: Family Medicine

## 2012-06-25 DIAGNOSIS — Z01818 Encounter for other preprocedural examination: Secondary | ICD-10-CM

## 2013-03-27 ENCOUNTER — Encounter (HOSPITAL_COMMUNITY)
Admission: EM | Disposition: A | Discharge status: Home / Self Care | Payer: Self-pay | Source: Home / Self Care | Attending: Emergency Medicine

## 2013-03-27 ENCOUNTER — Emergency Department (HOSPITAL_COMMUNITY)
Admission: EM | Admit: 2013-03-27 | Discharge: 2013-03-27 | Disposition: A | Discharge status: Home / Self Care | Payer: Medicare PPO | Attending: Emergency Medicine | Admitting: Emergency Medicine

## 2013-03-27 ENCOUNTER — Encounter (HOSPITAL_COMMUNITY): Payer: Self-pay | Admitting: Emergency Medicine

## 2013-03-27 DIAGNOSIS — T18108A Unspecified foreign body in esophagus causing other injury, initial encounter: Secondary | ICD-10-CM

## 2013-03-27 DIAGNOSIS — R1310 Dysphagia, unspecified: Secondary | ICD-10-CM | POA: Insufficient documentation

## 2013-03-27 DIAGNOSIS — R1319 Other dysphagia: Secondary | ICD-10-CM | POA: Diagnosis present

## 2013-03-27 HISTORY — PX: FOREIGN BODY REMOVAL: SHX962

## 2013-03-27 HISTORY — PX: ESOPHAGOGASTRODUODENOSCOPY: SHX5428

## 2013-03-27 HISTORY — DX: Mental disorder, not otherwise specified: F99

## 2013-03-27 HISTORY — DX: Inflammatory liver disease, unspecified: K75.9

## 2013-03-27 SURGERY — EGD (ESOPHAGOGASTRODUODENOSCOPY)
Anesthesia: Moderate Sedation

## 2013-03-27 MED ORDER — FENTANYL CITRATE 0.05 MG/ML IJ SOLN
INTRAMUSCULAR | Status: DC | PRN
  Administered 2013-03-27 (×4): 25 ug via INTRAVENOUS

## 2013-03-27 MED ORDER — HYDROMORPHONE HCL PF 1 MG/ML IJ SOLN
1.0000 mg | Freq: Once | INTRAMUSCULAR | Status: AC
  Administered 2013-03-27: 1 mg via INTRAMUSCULAR
  Filled 2013-03-27: qty 1

## 2013-03-27 MED ORDER — SODIUM CHLORIDE 0.9 % IV SOLN: INTRAVENOUS | Status: DC

## 2013-03-27 MED ORDER — DIPHENHYDRAMINE HCL 50 MG/ML IJ SOLN
INTRAMUSCULAR | Status: DC | PRN
  Administered 2013-03-27 (×2): 12.5 mg via INTRAVENOUS

## 2013-03-27 MED ORDER — BUTAMBEN-TETRACAINE-BENZOCAINE 2-2-14 % EX AERO
INHALATION_SPRAY | CUTANEOUS | Status: DC | PRN
  Administered 2013-03-27: 2 via TOPICAL

## 2013-03-27 MED ORDER — MIDAZOLAM HCL 10 MG/2ML IJ SOLN
INTRAMUSCULAR | Status: DC | PRN
  Administered 2013-03-27 (×5): 2 mg via INTRAVENOUS

## 2013-03-27 NOTE — Interval H&P Note (Signed)
History and Physical Interval Note:  03/27/2013 3:28 PM  [name]  has presented today for surgery, with the diagnosis of Foreign body  The various methods of treatment have been discussed with the patient and family. After consideration of risks, benefits and other options for treatment, the patient has consented to  Procedure(s): ESOPHAGOGASTRODUODENOSCOPY (EGD) (N/A) FOREIGN BODY REMOVAL (N/A) as a surgical intervention .  The patient's history has been reviewed, patient examined, no change in status, stable for surgery.  I have reviewed the patient's chart and labs.  Questions were answered to the patient's satisfaction.     Venita Lick. Russella Dar MD

## 2013-03-27 NOTE — Progress Notes (Signed)
   CARE MANAGEMENT ED NOTE 03/27/2013  Patient:  Radoncic,Thoams   Account Number:  [id number]  Date Initiated:  03/27/2013  Documentation initiated by:  Radford Pax  Subjective/Objective Assessment:   Patient presented to ED with esophageal pain     Subjective/Objective Assessment Detail:     Action/Plan:   Action/Plan Detail:   Anticipated DC Date:       Status Recommendation to Physician:   Result of Recommendation:    Other ED Services  Consult Working Plan    DC Planning Services  Other  PCP issues    Choice offered to / List presented to:            Status of service:  Completed, signed off  ED Comments:   ED Comments Detail:  Patient listed as not having a pcp.  EDCM spoke with patient who stated his pcp at the [place] is Dr. Ruben Reason, but his pcp in [place] is Dr. Docia Chuck Dibas.  Offered support to patient.  No further needs at this time.  System updated.

## 2013-03-27 NOTE — ED Notes (Signed)
Pt from home reporting "feels like my esophagus is going to burst". Pt states that he swallowed a handful of vitamins, psyllium, and chia seeds and now they are stuck. Pt is unable to swallow water, and is salivating into an emesis bag. Pt requesting to have stomach pumped to remove blockage from esophagus. Pt O2 sats 98% RA. Pt trying to vomit without success. Pt A&O and in NAD at this time.

## 2013-03-27 NOTE — Op Note (Signed)
[facility] [address] [place], [address]   ENDOSCOPY PROCEDURE REPORT  PATIENT: [name], [name]  MR#: [id number] BIRTHDATE: Dec 21, 1940 , 71  yrs. old GENDER: Male ENDOSCOPIST: Meryl Dare, MD, [facility] REFERRED BY:  [facility] ED PROCEDURE DATE:  03/27/2013 PROCEDURE:  EGD w/ fb removal ASA CLASS:     Class III INDICATIONS:  Dysphagia.   Foreign body removal from esophagus.  Pt with intermittent dysphagia for several montha and acute dysphagia today immediately after taking multiple vitamin pills, chia seed and psyllium MEDICATIONS: These medications were titrated to patient response per physician's verbal order, Fentanyl 100 mcg IV, Versed 10 mg IV, and Diphenhydramine (Benadryl) 25 mg IV TOPICAL ANESTHETIC: Cetacaine Spray DESCRIPTION OF PROCEDURE: After the risks benefits and alternatives of the procedure were thoroughly explained, informed consent was obtained.  The Pentax Gastroscope [id number] endoscope was introduced through the mouth and advanced to the second portion of the duodenum  without limitations.  The instrument was slowly withdrawn as the mucosa was fully examined.  ESOPHAGUS: There was LA Class C esophagitis noted. A stricture was found at the gastroesophageal junction.  The stenosis was traversable with the endoscope. A large impaction in the distal esophagus was completely cleared using the Roth net. The esophagus was otherwise normal. STOMACH: The mucosa and folds of the stomach appeared normal. DUODENUM: The duodenal mucosa showed no abnormalities in the bulb and second portion of the duodenum.  Retroflexed views revealed a moderate hiatal hernia.   The scope was then withdrawn from the patient and the procedure completed.  COMPLICATIONS: There were no complications. ENDOSCOPIC IMPRESSION: 1.   LA Class C esophagitis 2.   Stricture at the gastroesophageal junction 3.   Large esophageal impaction completely removed 4.    Moderate hiatal hernia  RECOMMENDATIONS: 1.  Anti-reflux regimen long term 2.  PPI qam: omeprazole 20 mg OTC po daily long term 3.   Follow up with your physicians at the [facility] for esophgeal dilation and ongoing mgmt 4.   Clear liquids overnight the full liquids tomorrow and then advance as tolerated.   eSigned:  Meryl Dare, MD, [facility] 03/27/2013 5:03 PM

## 2013-03-27 NOTE — ED Provider Notes (Signed)
History     CSN: [id number]  Arrival date & time 03/27/13  1330   First MD Initiated Contact with Patient 03/27/13 1402      Chief Complaint  Patient presents with  . Swallowed Foreign Body     HPI Pt was seen at 1405.  Per pt, c/o sudden onset and persistence of constant esophageal foreign body that began today PTA. Pt states he swallowed "at least 40 pills," followed by psyllium and chia seeds.  States he tried to drink a glass of water afterwards and "it got stuck."  States he has been nauseated and vomited small amounts "of what looks like concrete" PTA.  Pt states he "can't swallow by own spit" without N/V.  States he has experienced similar symptoms previously but "I could always swallow water and it would go down."  Denies abd pain, no diarrhea, no back pain, no CP/SOB.    History reviewed. No pertinent past medical history.  Past Surgical History  Procedure Laterality Date  . Knee surgery        History  Substance Use Topics  . Smoking status: Never Smoker   . Smokeless tobacco: Never Used  . Alcohol Use: No      Review of Systems ROS: Statement: All systems negative except as marked or noted in the HPI; Constitutional: Negative for fever and chills. ; ; Eyes: Negative for eye pain, redness and discharge. ; ; ENMT: Negative for ear pain, hoarseness, nasal congestion, sinus pressure and sore throat. ; ; Cardiovascular: Negative for chest pain, palpitations, diaphoresis, dyspnea and peripheral edema. ; ; Respiratory: Negative for cough, wheezing and stridor. ; ; Gastrointestinal: +N/V, esophageal FB.  Negative for diarrhea, abdominal pain, blood in stool, hematemesis, jaundice and rectal bleeding. . ; ; Genitourinary: Negative for dysuria, flank pain and hematuria. ; ; Musculoskeletal: Negative for back pain and neck pain. Negative for swelling and trauma.; ; Skin: Negative for pruritus, rash, abrasions, blisters, bruising and skin lesion.; ; Neuro: Negative for headache,  lightheadedness and neck stiffness. Negative for weakness, altered level of consciousness , altered mental status, extremity weakness, paresthesias, involuntary movement, seizure and syncope.       Allergies  Iohexol  Home Medications   Current Outpatient Rx  Name  Route  Sig  Dispense  Refill  . Ascorbic Acid (VITAMIN C PO)   Oral   Take 1 tablet by mouth daily.           . Calcium Carbonate-Vitamin D (CALCIUM + D PO)   Oral   Take 1 tablet by mouth daily.           . Cholecalciferol (VITAMIN D PO)   Oral   Take 1 tablet by mouth daily.           . clonazePAM (KLONOPIN) 0.5 MG tablet   Oral   Take 0.5 mg by mouth 2 (two) times daily as needed for anxiety (anxiety).         Marland Kitchen COENZYME Q-10 PO   Oral   Take 1 capsule by mouth daily.           . Cyanocobalamin (VITAMIN B-12 PO)   Oral   Take 1 tablet by mouth daily.           Marland Kitchen MAGNESIUM OXIDE PO   Oral   Take 1 tablet by mouth daily.           . Multiple Vitamins-Minerals (MULTIVITAMINS THER. W/MINERALS) TABS   Oral   Take 1  tablet by mouth daily.           . ranitidine (ZANTAC) 150 MG tablet   Oral   Take 150 mg by mouth daily as needed. For acid reflux          . Riboflavin (VITAMIN B-2 PO)   Oral   Take 1 tablet by mouth daily.           Marland Kitchen VITAMIN A PO   Oral   Take 1 capsule by mouth daily.             BP 152/84  Pulse 77  Temp(Src) 97.7 F (36.5 C) (Oral)  Resp 22  SpO2 98%  Physical Exam 1410: Physical examination:  Nursing notes reviewed; Vital signs and O2 SAT reviewed;  Constitutional: Well developed, Well nourished, Well hydrated, Uncomfortable appearing; Head:  Normocephalic, atraumatic; Eyes: EOMI, PERRL, No scleral icterus; ENMT: Mouth and pharynx normal, Mucous membranes moist; Neck: Supple, Full range of motion, No lymphadenopathy; Cardiovascular: Regular rate and rhythm, No gallop; Respiratory: Breath sounds clear & equal bilaterally, No rales, rhonchi, wheezes.   Speaking full sentences with ease, Normal respiratory effort/excursion; Chest: Nontender, Movement normal; Abdomen: Soft, Nontender, Nondistended, Normal bowel sounds; Genitourinary: No CVA tenderness; Extremities: Pulses normal, No tenderness, No edema, No calf edema or asymmetry.; Neuro: AA&Ox3, Major CN grossly intact.  Speech clear. No gross focal motor or sensory deficits in extremities.; Skin: Color normal, Warm, Dry.   ED Course  Procedures    MDM  MDM Reviewed: previous chart, nursing note and vitals     1420:  Pt spitting his oral secretions into an emesis bag. Unable to tol PO without N/V; pt points to his mid-chest and states "it gets stuck right here."  T/C to GI service, case discussed, including:  HPI, pertinent PM/SHx, VS/PE, dx testing, ED course and treatment:  Agreeable to come to ED for eval for emergent EGD. Pt aware and agreeable.          Laray Anger, DO 03/28/13 0740

## 2013-03-27 NOTE — ED Notes (Signed)
MD at bedside. 

## 2013-03-27 NOTE — Consult Note (Signed)
Referring Provider: No ref. provider found Primary Care Physician:  No primary provider on file. Primary Gastroenterologist:  Gentry Fitz.  Patient follows at the [place].  Reason for Consultation:  Esophageal foreign body  HPI: [name] is a 72 y.o. male who states that he took a bunch of vitamins, psyllium husk, and chia seeds today as his usual routine at home.  When he took them they immediately got stuck in his esophagus.  He has a lot of pain in the center of his chest and has been unable to swallow water or his own saliva.  He tried gagging to get it to come up and even tried swallowing some coconut oil to lubricate it but nothing helped.  He reports that similar episodes have occurred in the past, but they have always resolved in 10 or 15 minutes without intervention.  Never had a GI work-up except for colonoscopy, which he gets done at the [place].  He is breathing and talking fine during our interview, but does get very uncomfortable at times and heaves/vomits saliva.   History reviewed. No pertinent past medical history.  Past Surgical History  Procedure Laterality Date  . Knee surgery      Prior to Admission medications   Medication Sig Start Date End Date Taking? Authorizing Provider  Ascorbic Acid (VITAMIN C PO) Take 1 tablet by mouth daily.     Yes Historical Provider, MD  Calcium Carbonate-Vitamin D (CALCIUM + D PO) Take 1 tablet by mouth daily.     Yes Historical Provider, MD  Cholecalciferol (VITAMIN D PO) Take 1 tablet by mouth daily.     Yes Historical Provider, MD  clonazePAM (KLONOPIN) 0.5 MG tablet Take 0.5 mg by mouth 2 (two) times daily as needed for anxiety (anxiety).   Yes Historical Provider, MD  COENZYME Q-10 PO Take 1 capsule by mouth daily.     Yes Historical Provider, MD  Cyanocobalamin (VITAMIN B-12 PO) Take 1 tablet by mouth daily.     Yes Historical Provider, MD  MAGNESIUM OXIDE PO Take 1 tablet by mouth daily.     Yes Historical Provider, MD  Multiple  Vitamins-Minerals (MULTIVITAMINS THER. W/MINERALS) TABS Take 1 tablet by mouth daily.     Yes Historical Provider, MD  ranitidine (ZANTAC) 150 MG tablet Take 150 mg by mouth daily as needed. For acid reflux    Yes Historical Provider, MD  Riboflavin (VITAMIN B-2 PO) Take 1 tablet by mouth daily.     Yes Historical Provider, MD  VITAMIN A PO Take 1 capsule by mouth daily.     Yes Historical Provider, MD    Current Facility-Administered Medications  Medication Dose Route Frequency Provider Last Rate Last Dose  . 0.9 %  sodium chloride infusion   Intravenous Continuous Laray Anger, DO      . HYDROmorphone (DILAUDID) injection 1 mg  1 mg Intramuscular Once Jessica D. Zehr, PA-C       Current Outpatient Prescriptions  Medication Sig Dispense Refill  . Ascorbic Acid (VITAMIN C PO) Take 1 tablet by mouth daily.        . Calcium Carbonate-Vitamin D (CALCIUM + D PO) Take 1 tablet by mouth daily.        . Cholecalciferol (VITAMIN D PO) Take 1 tablet by mouth daily.        . clonazePAM (KLONOPIN) 0.5 MG tablet Take 0.5 mg by mouth 2 (two) times daily as needed for anxiety (anxiety).      Marland Kitchen COENZYME Q-10  PO Take 1 capsule by mouth daily.        . Cyanocobalamin (VITAMIN B-12 PO) Take 1 tablet by mouth daily.        Marland Kitchen MAGNESIUM OXIDE PO Take 1 tablet by mouth daily.        . Multiple Vitamins-Minerals (MULTIVITAMINS THER. W/MINERALS) TABS Take 1 tablet by mouth daily.        . ranitidine (ZANTAC) 150 MG tablet Take 150 mg by mouth daily as needed. For acid reflux       . Riboflavin (VITAMIN B-2 PO) Take 1 tablet by mouth daily.        Marland Kitchen VITAMIN A PO Take 1 capsule by mouth daily.          Allergies as of 03/27/2013 - Review Complete 03/27/2013  Allergen Reaction Noted  . Iohexol  06/07/2010    No family history on file.  History   Social History  . Marital Status: Single    Spouse Name: N/A    Number of Children: N/A  . Years of Education: N/A   Occupational History  . Not on  file.   Social History Main Topics  . Smoking status: Never Smoker   . Smokeless tobacco: Never Used  . Alcohol Use: No  . Drug Use:   . Sexually Active:    Other Topics Concern  . Not on file   Social History Narrative  . No narrative on file    Review of Systems: Ten point ROS is O/W negative except as mentioned in HPI.  Physical Exam: Vital signs in last 24 hours: Temp:  [97.7 F (36.5 C)] 97.7 F (36.5 C) (06/20 1330) Pulse Rate:  [77] 77 (06/20 1330) Resp:  [22] 22 (06/20 1330) BP: (152)/(84) 152/84 mmHg (06/20 1330) SpO2:  [97 %-98 %] 98 % (06/20 1338)   General:   Alert, Well-developed, well-nourished, pleasant and cooperative; very uncomfortable. Head:  Normocephalic and atraumatic. Eyes:  Sclera clear, no icterus.  Conjunctiva pink. Ears:  Normal auditory acuity. Mouth:  No deformity or lesions.   Lungs:  Clear throughout to auscultation.  No wheezes, crackles, or rhonchi. Heart:  Regular rate and rhythm; no murmurs, clicks, rubs,  or gallops. Abdomen:  Soft, nontender, BS active, nonpalp mass or hsm.   Rectal:  Deferred  Msk:  Symmetrical without gross deformities. Pulses:  Normal pulses noted. Extremities:  Without clubbing or edema. Neurologic:  Alert and  oriented x4;  grossly normal neurologically. Skin:  Intact without significant lesions or rashes. Psych:  Alert and cooperative. Normal mood and affect.  IMPRESSION:  -Esophageal foreign body:  Swallowed several vitamins, psyllium, and chia seeds.  PLAN: -Will plan EGD today.   ZEHR, JESSICA D.  03/27/2013, 3:07 PM  Pager number [phone]     Attending physician's note   I have taken a history, examined the patient and reviewed the chart. I agree with the Advanced Practitioner's note, impression and recommendations.  Meryl Dare, MD Clementeen Graham

## 2013-03-27 NOTE — H&P (View-Only) (Signed)
Referring Provider: No ref. provider found Primary Care Physician:  No primary provider on file. Primary Gastroenterologist:  Unassigned.  [facility]  Reason for Consultation:  Esophageal foreign body  HPI: [name] is a 72 y.o. male who states that he took a bunch of vitamins, psyllium husk, and chia seeds today as his usual routine at home.  When he took them they immediately got stuck in his esophagus.  He has a lot of pain in the center of his chest and has been unable to swallow water or his own saliva.  He tried gagging to get it to come up and even tried swallowing some coconut oil to lubricate it but nothing helped.  He reports that similar episodes have occurred in the past, but they have always resolved in 10 or 15 minutes without intervention.  Never had a GI work-up except for colonoscopy, which [facility].  He is breathing and talking fine during our interview, but does get very uncomfortable at times and heaves/vomits saliva.   History reviewed. No pertinent past medical history.  Past Surgical History  Procedure Laterality Date  . Knee surgery      Prior to Admission medications   Medication Sig Start Date End Date Taking? Authorizing Provider  Ascorbic Acid (VITAMIN C PO) Take 1 tablet by mouth daily.     Yes Historical Provider, MD  Calcium Carbonate-Vitamin D (CALCIUM + D PO) Take 1 tablet by mouth daily.     Yes Historical Provider, MD  Cholecalciferol (VITAMIN D PO) Take 1 tablet by mouth daily.     Yes Historical Provider, MD  clonazePAM (KLONOPIN) 0.5 MG tablet Take 0.5 mg by mouth 2 (two) times daily as needed for anxiety (anxiety).   Yes Historical Provider, MD  COENZYME Q-10 PO Take 1 capsule by mouth daily.     Yes Historical Provider, MD  Cyanocobalamin (VITAMIN B-12 PO) Take 1 tablet by mouth daily.     Yes Historical Provider, MD  MAGNESIUM OXIDE PO Take 1 tablet by mouth daily.     Yes Historical Provider, MD  Multiple  Vitamins-Minerals (MULTIVITAMINS THER. W/MINERALS) TABS Take 1 tablet by mouth daily.     Yes Historical Provider, MD  ranitidine (ZANTAC) 150 MG tablet Take 150 mg by mouth daily as needed. For acid reflux    Yes Historical Provider, MD  Riboflavin (VITAMIN B-2 PO) Take 1 tablet by mouth daily.     Yes Historical Provider, MD  VITAMIN A PO Take 1 capsule by mouth daily.     Yes Historical Provider, MD    Current Facility-Administered Medications  Medication Dose Route Frequency Provider Last Rate Last Dose  . 0.9 %  sodium chloride infusion   Intravenous Continuous Kathleen M McManus, DO      . HYDROmorphone (DILAUDID) injection 1 mg  1 mg Intramuscular Once Jessica D. Zehr, PA-C       Current Outpatient Prescriptions  Medication Sig Dispense Refill  . Ascorbic Acid (VITAMIN C PO) Take 1 tablet by mouth daily.        . Calcium Carbonate-Vitamin D (CALCIUM + D PO) Take 1 tablet by mouth daily.        . Cholecalciferol (VITAMIN D PO) Take 1 tablet by mouth daily.        . clonazePAM (KLONOPIN) 0.5 MG tablet Take 0.5 mg by mouth 2 (two) times daily as needed for anxiety (anxiety).      . COENZYME Q-10   PO Take 1 capsule by mouth daily.        . Cyanocobalamin (VITAMIN B-12 PO) Take 1 tablet by mouth daily.        . MAGNESIUM OXIDE PO Take 1 tablet by mouth daily.        . Multiple Vitamins-Minerals (MULTIVITAMINS THER. W/MINERALS) TABS Take 1 tablet by mouth daily.        . ranitidine (ZANTAC) 150 MG tablet Take 150 mg by mouth daily as needed. For acid reflux       . Riboflavin (VITAMIN B-2 PO) Take 1 tablet by mouth daily.        . VITAMIN A PO Take 1 capsule by mouth daily.          Allergies as of 03/27/2013 - Review Complete 03/27/2013  Allergen Reaction Noted  . Iohexol  06/07/2010    No family history on file.  History   Social History  . Marital Status: Single    Spouse Name: N/A    Number of Children: N/A  . Years of Education: N/A   Occupational History  . Not on  file.   Social History Main Topics  . Smoking status: Never Smoker   . Smokeless tobacco: Never Used  . Alcohol Use: No  . Drug Use:   . Sexually Active:    Other Topics Concern  . Not on file   Social History Narrative  . No narrative on file    Review of Systems: Ten point ROS is O/W negative except as mentioned in HPI.  Physical Exam: Vital signs in last 24 hours: Temp:  [97.7 F (36.5 C)] 97.7 F (36.5 C) (06/20 1330) Pulse Rate:  [77] 77 (06/20 1330) Resp:  [22] 22 (06/20 1330) BP: (152)/(84) 152/84 mmHg (06/20 1330) SpO2:  [97 %-98 %] 98 % (06/20 1338)   General:   Alert, Well-developed, well-nourished, pleasant and cooperative; very uncomfortable. Head:  Normocephalic and atraumatic. Eyes:  Sclera clear, no icterus.  Conjunctiva pink. Ears:  Normal auditory acuity. Mouth:  No deformity or lesions.   Lungs:  Clear throughout to auscultation.  No wheezes, crackles, or rhonchi. Heart:  Regular rate and rhythm; no murmurs, clicks, rubs,  or gallops. Abdomen:  Soft, nontender, BS active, nonpalp mass or hsm.   Rectal:  Deferred  Msk:  Symmetrical without gross deformities. Pulses:  Normal pulses noted. Extremities:  Without clubbing or edema. Neurologic:  Alert and  oriented x4;  grossly normal neurologically. Skin:  Intact without significant lesions or rashes. Psych:  Alert and cooperative. Normal mood and affect.  IMPRESSION:  -Esophageal foreign body:  Swallowed several vitamins, psyllium, and chia seeds.  PLAN: -Will plan EGD today.   ZEHR, JESSICA D.  03/27/2013, 3:07 PM  Pager number [phone]     Attending physician's note   I have taken a history, examined the patient and reviewed the chart. I agree with the Advanced Practitioner's note, impression and recommendations.  Bonnell Placzek T Tayva Easterday, MD FACG   

## 2013-03-31 ENCOUNTER — Encounter (HOSPITAL_COMMUNITY): Payer: Self-pay | Admitting: Gastroenterology

## 2013-12-04 ENCOUNTER — Other Ambulatory Visit: Payer: Self-pay | Admitting: Family Medicine

## 2013-12-04 ENCOUNTER — Ambulatory Visit
Admission: RE | Admit: 2013-12-04 | Discharge: 2013-12-04 | Disposition: A | Discharge status: Home / Self Care | Payer: Commercial Managed Care - HMO | Source: Ambulatory Visit | Attending: Family Medicine | Admitting: Family Medicine

## 2013-12-04 DIAGNOSIS — W19XXXA Unspecified fall, initial encounter: Secondary | ICD-10-CM

## 2014-10-25 DIAGNOSIS — L989 Disorder of the skin and subcutaneous tissue, unspecified: Secondary | ICD-10-CM | POA: Diagnosis not present

## 2014-10-25 DIAGNOSIS — G454 Transient global amnesia: Secondary | ICD-10-CM | POA: Diagnosis not present

## 2014-10-27 ENCOUNTER — Other Ambulatory Visit: Payer: Self-pay | Admitting: Family Medicine

## 2014-10-27 DIAGNOSIS — G454 Transient global amnesia: Secondary | ICD-10-CM

## 2014-11-04 ENCOUNTER — Ambulatory Visit
Admission: RE | Admit: 2014-11-04 | Discharge: 2014-11-04 | Disposition: A | Discharge status: Home / Self Care | Payer: Commercial Managed Care - HMO | Source: Ambulatory Visit | Attending: Family Medicine | Admitting: Family Medicine

## 2014-11-04 DIAGNOSIS — G454 Transient global amnesia: Secondary | ICD-10-CM

## 2014-11-04 DIAGNOSIS — H539 Unspecified visual disturbance: Secondary | ICD-10-CM | POA: Diagnosis not present

## 2014-11-04 DIAGNOSIS — R4701 Aphasia: Secondary | ICD-10-CM | POA: Diagnosis not present

## 2014-11-04 DIAGNOSIS — G459 Transient cerebral ischemic attack, unspecified: Secondary | ICD-10-CM | POA: Diagnosis not present

## 2014-11-09 ENCOUNTER — Other Ambulatory Visit: Payer: Self-pay | Admitting: Family Medicine

## 2014-11-09 DIAGNOSIS — I6509 Occlusion and stenosis of unspecified vertebral artery: Secondary | ICD-10-CM

## 2014-11-10 ENCOUNTER — Ambulatory Visit
Admission: RE | Admit: 2014-11-10 | Discharge: 2014-11-10 | Disposition: A | Discharge status: Home / Self Care | Payer: Commercial Managed Care - HMO | Source: Ambulatory Visit | Attending: Family Medicine | Admitting: Family Medicine

## 2014-11-10 DIAGNOSIS — I6501 Occlusion and stenosis of right vertebral artery: Secondary | ICD-10-CM | POA: Diagnosis not present

## 2014-11-10 DIAGNOSIS — I6509 Occlusion and stenosis of unspecified vertebral artery: Secondary | ICD-10-CM

## 2014-11-10 MED ORDER — GADOBENATE DIMEGLUMINE 529 MG/ML IV SOLN
18.0000 mL | Freq: Once | INTRAVENOUS | Status: AC | PRN
  Administered 2014-11-10: 18 mL via INTRAVENOUS

## 2014-11-12 DIAGNOSIS — Z136 Encounter for screening for cardiovascular disorders: Secondary | ICD-10-CM | POA: Diagnosis not present

## 2014-11-12 DIAGNOSIS — I739 Peripheral vascular disease, unspecified: Secondary | ICD-10-CM | POA: Diagnosis not present

## 2014-11-18 ENCOUNTER — Encounter: Payer: Self-pay | Admitting: Vascular Surgery

## 2014-11-19 ENCOUNTER — Ambulatory Visit (INDEPENDENT_AMBULATORY_CARE_PROVIDER_SITE_OTHER): Payer: Commercial Managed Care - HMO | Admitting: Vascular Surgery

## 2014-11-19 ENCOUNTER — Encounter: Payer: Self-pay | Admitting: Vascular Surgery

## 2014-11-19 VITALS — BP 123/77 | HR 75 | Ht 72.0 in | Wt 189.0 lb

## 2014-11-19 DIAGNOSIS — G459 Transient cerebral ischemic attack, unspecified: Secondary | ICD-10-CM | POA: Diagnosis not present

## 2014-11-19 DIAGNOSIS — I6502 Occlusion and stenosis of left vertebral artery: Secondary | ICD-10-CM | POA: Diagnosis not present

## 2014-11-19 DIAGNOSIS — I6509 Occlusion and stenosis of unspecified vertebral artery: Secondary | ICD-10-CM | POA: Insufficient documentation

## 2014-11-19 MED ORDER — CLOPIDOGREL BISULFATE 75 MG PO TABS: 75.0000 mg | ORAL_TABLET | Freq: Every day | ORAL | Status: DC

## 2014-11-19 NOTE — Progress Notes (Addendum)
New Carotid Patient  Referred by:  Lujean Amel, MD [address], [place] [address]  Reason for referral: L VA occlusion  History of Present Illness  [name] is a 74 y.o. (May 17, 1941) male who presents with chief complaint: recent event with L eye visual disturbances, difficulty reading, and could not speak.  Pt was sent to have a MRI/A by his PCP.  This revealed: RICA minimal stenosis, LICA minimal stenosis, R VA 50% stenosis, L VA occluded.  Patient has prior history of TIA or stroke symptom.  The patient has never had amaurosis fugax or monocular blindness.  He has had scotomas before.  The patient has never had facial drooping or hemiplegia.  The patient has had expressive aphasia during his TIA which lasted <1 hour.   he patient's previous neurologic deficits have resolved.  The patient's risks factors for carotid disease include: former smoking.  Pt reported has a normal lipid panel as he is primarily a vegan.  Past Medical History  Diagnosis Date  . Hepatitis     Hep C  . Mental disorder     PTSD  . Stroke   . CAD (coronary artery disease)     Past Surgical History  Procedure Laterality Date  . Knee surgery    . Joint replacement    . Appendectomy    . Esophagogastroduodenoscopy N/A 03/27/2013    Procedure: ESOPHAGOGASTRODUODENOSCOPY (EGD);  Surgeon: Ladene Artist, MD;  Location: [facility];  Service: Endoscopy;  Laterality: N/A;  . Foreign body removal N/A 03/27/2013    Procedure: FOREIGN BODY REMOVAL;  Surgeon: Ladene Artist, MD;  Location: [facility];  Service: Endoscopy;  Laterality: N/A;    History   Social History  . Marital Status: Single    Spouse Name: N/A  . Number of Children: N/A  . Years of Education: N/A   Occupational History  . Not on file.   Social History Main Topics  . Smoking status: Former Smoker    Quit date: 10/09/1995  . Smokeless tobacco: Never Used  . Alcohol Use: No  . Drug Use: No  . Sexual  Activity: Not on file   Other Topics Concern  . Not on file   Social History Narrative    Family History  Problem Relation Age of Onset  . Heart disease Father   . Deep vein thrombosis Brother   . Diabetes Brother     Current Outpatient Prescriptions on File Prior to Visit  Medication Sig Dispense Refill  . Ascorbic Acid (VITAMIN C PO) Take 1 tablet by mouth daily.      . Calcium Carbonate-Vitamin D (CALCIUM + D PO) Take 1 tablet by mouth daily.      . Cholecalciferol (VITAMIN D PO) Take 1 tablet by mouth daily.      . clonazePAM (KLONOPIN) 0.5 MG tablet Take 0.5 mg by mouth 2 (two) times daily as needed for anxiety (anxiety).    Marland Kitchen COENZYME Q-10 PO Take 1 capsule by mouth daily.      Marland Kitchen MAGNESIUM OXIDE PO Take 1 tablet by mouth daily.      . Multiple Vitamins-Minerals (MULTIVITAMINS THER. W/MINERALS) TABS Take 1 tablet by mouth daily.      . ranitidine (ZANTAC) 150 MG tablet Take 150 mg by mouth daily as needed. For acid reflux     . Cyanocobalamin (VITAMIN B-12 PO) Take 1 tablet by mouth daily.      . Riboflavin (VITAMIN B-2 PO)  Take 1 tablet by mouth daily.      Marland Kitchen VITAMIN A PO Take 1 capsule by mouth daily.       No current facility-administered medications on file prior to visit.    Allergies  Allergen Reactions  . Iohexol      Code: SOB, Desc: ANAPHYLATIC SHOCK W/ IV CONTRAST/MMS     REVIEW OF SYSTEMS:  (Positives checked otherwise negative)  CARDIOVASCULAR:  []  chest pain, []  chest pressure, []  palpitations, []  shortness of breath when laying flat, []  shortness of breath with exertion,  []  pain in feet when walking, []  pain in feet when laying flat, []  history of blood clot in veins (DVT), []  history of phlebitis, []  swelling in legs, []  varicose veins  PULMONARY:  []  productive cough, []  asthma, []  wheezing  NEUROLOGIC:  []  weakness in arms or legs, []  numbness in arms or legs, [x]  difficulty speaking or slurred speech, [x]  temporary loss of vision in one eye, []   dizziness  HEMATOLOGIC:  []  bleeding problems, []  problems with blood clotting too easily  MUSCULOSKEL:  []  joint pain, []  joint swelling  GASTROINTEST:  []  vomiting blood, []  blood in stool     GENITOURINARY:  []  burning with urination, []  blood in urine  PSYCHIATRIC:  []  history of major depression  INTEGUMENTARY:  []  rashes, []  ulcers  CONSTITUTIONAL:  []  fever, []  chills  For VQI Use Only  PRE-ADM LIVING: Home  AMB STATUS: Ambulatory  CAD Sx: None  PRIOR CHF: None  STRESS TEST: [x]  No, [ ]  Normal, [ ]  + ischemia, [ ]  + MI, [ ]  Both   Physical Examination  Filed Vitals:   11/19/14 0859 11/19/14 0903  BP: 124/77 123/77  Pulse: 75   Height: 6' (1.829 m)   Weight: 189 lb (85.73 kg)   SpO2: 100%    Body mass index is 25.63 kg/(m^2).  General: A&O x 3, WD, thin  Head: [place]/AT  Ear/Nose/Throat: Hearing grossly intact, nares w/o erythema or drainage, oropharynx w/o Erythema/Exudate, Mallampati score: 3  Eyes: PERRLA, EOMI  Neck: Supple, no nuchal rigidity, no palpable LAD  Pulmonary: Sym exp, good air movt, CTAB, no rales, rhonchi, & wheezing  Cardiac: RRR, Nl S1, S2, no Murmurs, rubs or gallops  Vascular: Vessel Right Left  Radial Palpable Palpable  Brachial Palpable Palpable  Carotid Palpable, without bruit Palpable, without bruit  Aorta  Not palpable N/A  Femoral Palpable Palpable  Popliteal Not palpable Not palpable  PT Palpable Palpable  DP Palpable Palpable   Gastrointestinal: soft, NTND, -G/R, - HSM, - masses, - CVAT B  Musculoskeletal: M/S 5/5 throughout , Extremities without ischemic changes   Neurologic: CN 2-12 intact , Pain and light touch intact in extremities , Motor exam as listed above  Psychiatric: Judgment intact, Mood & affect appropriate for pt's clinical situation  Dermatologic: See M/S exam for extremity exam, no rashes otherwise noted  Lymph : No Cervical, Axillary, or Inguinal lymphadenopathy   MRA Neck (11/14/14)  No  carotid disease.  50% stenosis at the right vertebral artery origin, widely patent beyond that.  The left vertebral artery occluded at the origin. No antegrade intracranial flow. Retrograde flow in the distal left vertebral artery back to serve left PICA.  Based on my review of the MRA, this study is remarkable clean.  There is no evidence of significant internal carotid artery disease bilaterally.  There is some haziness around the right vertebral artery take off and the entire L [place] is  not visuallized.  Medical Decision Making  [name] is a 74 y.o. 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  I am referring him to Neurology for further work-up of etiology. His sx are unrelated to his L VA occlusion as he has never had vertebrobasilar sx.  Additionally, he has continued basilar artery flow via the R VA. In regards to the R VA orifical stenosis, I have found MRA frequently inadequate.  If he develops any vertebrobasilar sx, I would get a formal arch aortogram with R verterbral artery angiogram with neuroradiology. This patient has a significant trauma history from his service days, so I suspect this might have contributed to a vertebral artery dissection as the etiology of the occlusion, but this is merely speculation. His artery are so clean on the MRA, that I would be surprised if atherosclerosis contributed to the L VA occlusion. The patient is already taking a baby Aspirin.  I added Plavix 75 mg PO daily while he is undergoing his work-up with Neurology. The patient can follow up US as needed. The patient is aware that without maximal medical management the underlying atherosclerotic disease process will progress, limiting the benefit of any interventions. Thank you for allowing [place] to participate in this patient's care.  Adele Barthel, MD [facility]  [place] Office: [phone] Pager: [phone]  11/19/2014, 9:42 AM

## 2014-11-22 NOTE — Addendum Note (Signed)
Addended by: Mena Goes on: 11/22/2014 02:38 PM   Modules accepted: Orders

## 2014-11-26 ENCOUNTER — Ambulatory Visit (INDEPENDENT_AMBULATORY_CARE_PROVIDER_SITE_OTHER): Payer: Commercial Managed Care - HMO | Admitting: Neurology

## 2014-11-26 ENCOUNTER — Encounter: Payer: Self-pay | Admitting: Neurology

## 2014-11-26 VITALS — BP 124/74 | HR 99 | Resp 18 | Ht 72.0 in | Wt 192.0 lb

## 2014-11-26 DIAGNOSIS — R4701 Aphasia: Secondary | ICD-10-CM

## 2014-11-26 DIAGNOSIS — G459 Transient cerebral ischemic attack, unspecified: Secondary | ICD-10-CM

## 2014-11-26 NOTE — Patient Instructions (Addendum)
1. Schedule 2-D echocardiogram with bubble study 2. Routine EEG 3. Continue all your medications 4. If symptoms recur, call 911 immediately 5. Follow-up after the tests

## 2014-11-26 NOTE — Progress Notes (Signed)
NEUROLOGY CONSULTATION NOTE  [name] MRN: [id number] DOB: 11/19/1940  Referring provider: Dr. Adele Barthel Primary care provider: Dr. Lujean Amel  Reason for consult:  TIA  Dear Dr Bridgett Larsson:  Thank you for your kind referral of [name] for consultation of the above symptoms. Although his history is well known to you, please allow me to reiterate it for the purpose of our medical record. Records and images were personally reviewed where available.  HISTORY OF PRESENT ILLNESS: This is a very pleasant 74 year old right-handed man with a history of CAD, hepatitis C, PTSD, prior TIA 13 years ago, presenting for evaluation of transient visual and speech symptoms last 10/22/2014. He had been using the computer for 3 hours when he stood up and saw a bright light flash. He put a cold cloth over his eyes, called his brother, then lost the ability to put his words together. He kept repeating "I, I, I' with very limited speech output. His brother came, and the patient reports he walked outside and got the mail with no focal weakness noted. The program on TV did not make sense. He knew what was going on but could not read any words. He took his BP and noted it to be 187/82 (usually SBP in the 120s). He timed the event, which lasted 57 minutes. After the episode, he had a headache that occurred intermittently for 2 days, "like someone hit me on the head." He reports that the TIA 13 years ago was similar, he was walking the dog then noticed he could not read. This lasted 5 minutes, no associated headache. He started having visual disturbances after that, with a ball of flash of light and all kinds of colors on his left visual field where he could not see out of that side. He had seen [place], including Ophthalmology, and was told he had "vascular insufficiency." These episodes would be triggered by bright light, and had resolved many years ago.  Since January, there have been no similar episodes.  He had an MRI brain without contrast which I personally reviewed, unremarkable. His MRA head and neck showed suggestion of poor or absent flow in the distal left vertebral artery in the neck, with reconstituted flow in the left PICA and V4 segment from the right side. 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 Maximum medical management was recommended with addition of Plavix to aspirin. He is not on any statins, he reports last LDL was 90 and BP has always been perfect, he is very active.  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 Otherwise he had a normal birth and early development, no history of febrile convulsions, CNS infections, neurosurgical procedures, family history of seizures. He denies any history of prior migraines/headaches, dizziness, diplopia, dysarthria, dysphagia, neck/back pain, focal numbness/tingling/weakness, bowel/bladder dysfunction. He denies any gaps in time, staring/unresponsiveness, myoclonic jerks.  Laboratory Data 10/2014 CBC, CMP normal except for mildly low Na 133, B12 971, TSH 1.33  PAST MEDICAL HISTORY: Past Medical History  Diagnosis Date  . Hepatitis     Hep C  . Mental disorder     PTSD  . Stroke   . CAD (coronary artery disease)     PAST SURGICAL HISTORY: Past Surgical History  Procedure Laterality Date  . Knee surgery    . Joint replacement      Bilateral  .  Appendectomy    . Esophagogastroduodenoscopy N/A 03/27/2013    Procedure: ESOPHAGOGASTRODUODENOSCOPY (EGD);  Surgeon: Ladene Artist, MD;  Location: [facility];  Service: Endoscopy;  Laterality: N/A;  . Foreign body removal N/A 03/27/2013    Procedure: FOREIGN BODY REMOVAL;  Surgeon: Ladene Artist, MD;  Location: [facility];  Service: Endoscopy;  Laterality: N/A;  . Tonsillectomy      MEDICATIONS: Current Outpatient Prescriptions on File Prior to  Visit  Medication Sig Dispense Refill  . Ascorbic Acid (VITAMIN C PO) Take 1 tablet by mouth daily.      . Cholecalciferol (VITAMIN D PO) Take 1 tablet by mouth daily.      . clonazePAM (KLONOPIN) 0.5 MG tablet Take 0.5 mg by mouth 2 (two) times daily as needed for anxiety (anxiety).    Marland Kitchen COENZYME Q-10 PO Take 1 capsule by mouth daily.      . Cyanocobalamin (VITAMIN B-12 PO) Take 1 tablet by mouth daily.      Marland Kitchen MAGNESIUM OXIDE PO Take 1 tablet by mouth daily.      . Multiple Vitamins-Minerals (MULTIVITAMINS THER. W/MINERALS) TABS Take 1 tablet by mouth daily.      . ranitidine (ZANTAC) 150 MG tablet Take 150 mg by mouth daily as needed. For acid reflux     . Riboflavin (VITAMIN B-2 PO) Take 1 tablet by mouth daily.      Marland Kitchen VITAMIN A PO Take 1 capsule by mouth daily.      . clopidogrel (PLAVIX) 75 MG tablet Take 1 tablet (75 mg total) by mouth daily. (Patient not taking: Reported on 11/26/2014) 30 tablet 11   No current facility-administered medications on file prior to visit.    ALLERGIES: Allergies  Allergen Reactions  . Iohexol      Code: SOB, Desc: ANAPHYLATIC SHOCK W/ IV CONTRAST/MMS     FAMILY HISTORY: Family History  Problem Relation Age of Onset  . Heart disease Father   . Deep vein thrombosis Brother   . Diabetes Brother     SOCIAL HISTORY: History   Social History  . Marital Status: Single    Spouse Name: N/A  . Number of Children: N/A  . Years of Education: N/A   Occupational History  . Not on file.   Social History Main Topics  . Smoking status: Former Smoker    Quit date: 10/09/1995  . Smokeless tobacco: Never Used  . Alcohol Use: No  . Drug Use: No  . Sexual Activity: Not on file   Other Topics Concern  . Not on file   Social History Narrative    REVIEW OF SYSTEMS: Constitutional: No fevers, chills, or sweats, no generalized fatigue, change in appetite Eyes: No visual changes, double vision, eye pain Ear, nose and throat: No hearing loss, ear  pain, nasal congestion, sore throat Cardiovascular: No chest pain, palpitations Respiratory:  No shortness of breath at rest or with exertion, wheezes GastrointestinaI: No nausea, vomiting, diarrhea, abdominal pain, fecal incontinence Genitourinary:  No dysuria, urinary retention or frequency Musculoskeletal:  No neck pain, back pain Integumentary: No rash, pruritus, skin lesions Neurological: as above Psychiatric: No depression, insomnia, anxiety Endocrine: No palpitations, fatigue, diaphoresis, mood swings, change in appetite, change in weight, increased thirst Hematologic/Lymphatic:  No anemia, purpura, petechiae. Allergic/Immunologic: no itchy/runny eyes, nasal congestion, recent allergic reactions, rashes  PHYSICAL EXAM: Filed Vitals:   11/26/14 1456  BP: 124/74  Pulse: 99  Resp: 18   General: No acute distress Head:  Normocephalic/atraumatic Eyes: Fundoscopic  exam shows bilateral sharp discs, no vessel changes, exudates, or hemorrhages Neck: supple, no paraspinal tenderness, full range of motion Back: No paraspinal tenderness Heart: regular rate and rhythm Lungs: Clear to auscultation bilaterally. Vascular: No carotid bruits. Skin/Extremities: No rash, no edema Neurological Exam: Mental status: alert and oriented to person, place, and time, no dysarthria or aphasia, Fund of knowledge is appropriate.  Recent and remote memory are intact.  Attention and concentration are normal.    Able to name objects and repeat phrases. Cranial nerves: CN I: not tested CN II: pupils equal, round and reactive to light, visual fields intact, fundi unremarkable. CN III, IV, VI:  full range of motion, no nystagmus, no ptosis CN V: facial sensation intact CN VII: upper and lower face symmetric CN VIII: hearing intact to finger rub CN IX, X: gag intact, uvula midline CN XI: sternocleidomastoid and trapezius muscles intact CN XII: tongue midline Bulk & Tone: normal, no fasciculations. Motor:  5/5 throughout with no pronator drift. Sensation: decreased vibration sense up to left ankle, otherwise intact to light touch, cold, pin, and joint position sense.  No extinction to double simultaneous stimulation.  Romberg test negative Deep Tendon Reflexes: +2 throughout except for absent ankle jerks bilaterally, no ankle clonus Plantar responses: downgoing bilaterally Cerebellar: no incoordination on finger to nose, heel to shin. No dysdiadochokinesia Gait: narrow-based and steady, able to tandem walk adequately. Tremor: none  IMPRESSION: This is a pleasant 74 year old right-handed man with a history of CAD, hepatitis C, PTSD, presenting with a transient episode of visual changes followed by aphasia. He had a headache after the episode. 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 His MRI brain does not show any acute infarct, MRA head and neck shows vertebral artery occlusion, which would not cause aphasia. Considerations include TIA, partial seizure, complicated migraines. Echocardiogram will be ordered as part of TIA workup. Check routine EEG to assess for focal abnormalities that increase risk for recurrent seizures. Continue aspirin and Plavix for now. He will continue to monitor symptoms and knows to call 911 if symptoms recur. He will follow-up after the tests.   Thank you for allowing me to participate in the care of this patient. Please do not hesitate to call for any questions or concerns.   Ellouise Newer, M.D.  CC: Dr. Dorthy Cooler, Dr. Bridgett Larsson

## 2014-11-29 ENCOUNTER — Ambulatory Visit (HOSPITAL_COMMUNITY): Payer: Commercial Managed Care - HMO | Attending: Cardiovascular Disease | Admitting: Cardiology

## 2014-11-29 ENCOUNTER — Other Ambulatory Visit (HOSPITAL_COMMUNITY): Payer: Self-pay | Admitting: Neurology

## 2014-11-29 ENCOUNTER — Encounter (HOSPITAL_COMMUNITY): Payer: Self-pay

## 2014-11-29 DIAGNOSIS — G459 Transient cerebral ischemic attack, unspecified: Secondary | ICD-10-CM

## 2014-11-29 NOTE — Progress Notes (Signed)
[name] (DOB: 1941-03-17)  IV 22G angiocath (R) AC x 1, site unremarkable,tolerated well for Bubble Study. Irven Baltimore, RN.

## 2014-11-29 NOTE — Progress Notes (Signed)
Echo performed. 

## 2014-11-30 ENCOUNTER — Other Ambulatory Visit: Payer: Commercial Managed Care - HMO

## 2014-11-30 ENCOUNTER — Encounter: Payer: Self-pay | Admitting: Neurology

## 2014-11-30 ENCOUNTER — Telehealth: Payer: Self-pay | Admitting: Neurology

## 2014-11-30 NOTE — Telephone Encounter (Signed)
Pt came to the office to have his EEG test. Pt was checked in but when Susie took him back and informed him how much we would bill  His insurance for his EEG and we did not know what his portion would be, pt decided not to have his EEG done and stated that he will call his insurance  And confirm how it will be before r/s.

## 2014-11-30 NOTE — Telephone Encounter (Signed)
appt marked as pt left without seen / Sherri

## 2014-12-01 ENCOUNTER — Telehealth: Payer: Self-pay | Admitting: Neurology

## 2014-12-01 DIAGNOSIS — R931 Abnormal findings on diagnostic imaging of heart and coronary circulation: Secondary | ICD-10-CM

## 2014-12-01 NOTE — Telephone Encounter (Signed)
Discussed echo showing distal septal hypokinesis. EF normal. Would recommend Cards evaluation. Discussed with patient, who expressed understanding.  Tiffany, pls refer to Cardiology for abnormal echo. Thanks

## 2014-12-02 NOTE — Telephone Encounter (Signed)
Referral sent to Cardiology

## 2014-12-07 ENCOUNTER — Ambulatory Visit (INDEPENDENT_AMBULATORY_CARE_PROVIDER_SITE_OTHER): Payer: Commercial Managed Care - HMO | Admitting: Neurology

## 2014-12-07 DIAGNOSIS — G459 Transient cerebral ischemic attack, unspecified: Secondary | ICD-10-CM | POA: Diagnosis not present

## 2014-12-07 DIAGNOSIS — R4701 Aphasia: Secondary | ICD-10-CM

## 2014-12-08 NOTE — Procedures (Signed)
ELECTROENCEPHALOGRAM REPORT  Date of Study: 12/07/2014  Patient's Name: [name] MRN: [id number] Date of Birth: 1941-08-18  Referring Provider: Dr. Ellouise Newer  Clinical History: This is a 74 year old man with a transient episode of visual changes followed by aphasia. He had a headache after the episode. He reports a similar episode of aphasia 13 years ago.  Medications: clonazepam  Technical Summary: A multichannel digital EEG recording measured by the international 10-20 system with electrodes applied with paste and impedances below 5000 ohms performed in our laboratory with EKG monitoring in an awake and drowsy patient.  Hyperventilation and photic stimulation were performed.  The digital EEG was referentially recorded, reformatted, and digitally filtered in a variety of bipolar and referential montages for optimal display.    Description: The patient is awake and drowsy during the recording.  During maximal wakefulness, there is a symmetric, medium voltage 9.5-10 Hz posterior dominant rhythm that attenuates with eye opening.  The record is symmetric.  During drowsiness, there is an increase in theta slowing of the background, at times sharply contoured over the left temporal region consistent with wicket spikes, a normal variant with no pathological significance. Deeper stages of sleep were not seen. Hyperventilation and photic stimulation did not elicit any abnormalities.  There were no clear epileptiform discharges or electrographic seizures seen.    EKG lead was unremarkable.  Impression: This awake and drowsy EEG is within normal limits.  Clinical Correlation: A normal EEG does not exclude a clinical diagnosis of epilepsy.  If further clinical questions remain, prolonged EEG may be helpful.  Clinical correlation is advised.   Ellouise Newer, M.D.

## 2014-12-09 DIAGNOSIS — I6509 Occlusion and stenosis of unspecified vertebral artery: Secondary | ICD-10-CM | POA: Diagnosis not present

## 2014-12-27 NOTE — Progress Notes (Signed)
Patient ID: [name], male   DOB: 18-Sep-1941, 74 y.o.   MRN: [id number]     Cardiology Office Note   Date:  12/29/2014   ID:  [name], DOB [birth year], MRN [id number]  PCP:  John Amel, MD  Cardiologist:   John Rouge, MD   Chief Complaint  Patient presents with  . Advice Only    EKG PERFORMED      History of Present Illness: [name] is a 74 y.o. male who presents for evaluation of TIA and "abnormal echo"  She has been evaluated by Dr [name] from [facility] for TIA. MRI/MRA with no abnormalities other than left vertebral artery chronic occlusion.  Reviewed his echo and it is essentially normal except for mention of septal hypokinesis With EF 55% and negative contrast bubble study.   He indicates having a TIA 13 years ago or transient global amnesia.  He is not taking his ASA or Plavix Says he had Hepatitis C over a decade ago and bruises easily   Echo 11/29/14 Study Conclusions  - Left ventricle: Distal septal hypokinesis. The cavity size was normal. Systolic function was normal. The estimated ejection fraction was 55%. Wall motion was normal; there were no regional wall motion abnormalities. Doppler parameters are consistent with abnormal left ventricular relaxation (grade 1 diastolic dysfunction). - Left atrium: The atrium was mildly dilated. - Atrial septum: No defect or patent foramen ovale was identified. Echo contrast study showed no right-to-left atrial level shunt, at baseline or with provocation.    Past Medical History  Diagnosis Date  . Hepatitis     Hep C  . Mental disorder     PTSD  . Stroke   . CAD (coronary artery disease)     Past Surgical History  Procedure Laterality Date  . Knee surgery    . Joint replacement      Bilateral  . Appendectomy    . Esophagogastroduodenoscopy N/A 03/27/2013    Procedure: ESOPHAGOGASTRODUODENOSCOPY (EGD);  Surgeon: Ladene Artist, MD;  Location: [facility];  Service:  Endoscopy;  Laterality: N/A;  . Foreign body removal N/A 03/27/2013    Procedure: FOREIGN BODY REMOVAL;  Surgeon: Ladene Artist, MD;  Location: [facility];  Service: Endoscopy;  Laterality: N/A;  . Tonsillectomy       Current Outpatient Prescriptions  Medication Sig Dispense Refill  . Ascorbic Acid (VITAMIN C PO) Take 1 tablet by mouth daily.      . Cholecalciferol (VITAMIN D PO) Take 1 tablet by mouth daily.      . clonazePAM (KLONOPIN) 0.5 MG tablet Take 0.5 mg by mouth 2 (two) times daily as needed for anxiety (anxiety).    Marland Kitchen COENZYME Q-10 PO Take 1 capsule by mouth daily.      . Cyanocobalamin (VITAMIN B-12 PO) Take 1 tablet by mouth daily.      Marland Kitchen MAGNESIUM OXIDE PO Take 1 tablet by mouth daily.      . Multiple Vitamins-Minerals (MULTIVITAMINS THER. W/MINERALS) TABS Take 1 tablet by mouth daily.      . ranitidine (ZANTAC) 150 MG tablet Take 150 mg by mouth daily as needed. For acid reflux     . Riboflavin (VITAMIN B-2 PO) Take 1 tablet by mouth daily.      Marland Kitchen VITAMIN A PO Take 1 capsule by mouth daily.      Marland Kitchen aspirin 81 MG tablet Take 81 mg by mouth daily.    . clopidogrel (PLAVIX) 75 MG tablet Take 1  tablet (75 mg total) by mouth daily. (Patient not taking: Reported on 11/26/2014) 30 tablet 11   No current facility-administered medications for this visit.    Allergies:   Iohexol    Social History:  The patient  reports that he quit smoking about 19 years ago. He has never used smokeless tobacco. He reports that he does not drink alcohol or use illicit drugs.   Family History:  The patient's family history includes Deep vein thrombosis in his brother; Diabetes in his brother; Heart disease in his father.    ROS:  Please see the history of present illness.   Otherwise, review of systems are positive for none.   All other systems are reviewed and negative.    PHYSICAL EXAM: VS:  BP 126/80 mmHg  Pulse 83  Ht 6\' 1"  (1.854 m)  Wt 190 lb (86.183 kg)  BMI 25.07 kg/m2 , BMI Body  mass index is 25.07 kg/(m^2). GEN: Well nourished, well developed, in no acute distress HEENT: normal Neck: no JVD, carotid bruits, or masses Cardiac:  RRR; no murmurs, rubs, or gallops,no edema  Respiratory:  clear to auscultation bilaterally, normal work of breathing GI: soft, nontender, nondistended, + BS MS: no deformity or atrophy Skin: warm and dry, no rash Neuro:  Strength and sensation are intact Psych: euthymic mood, full affect   EKG:   2012  NSR normal ECG   12/29/14  SR rate 84 normal    Recent Labs: No results found for requested labs within last 365 days.    Lipid Panel No results found for: CHOL, TRIG, HDL, CHOLHDL, VLDL, LDLCALC, LDLDIRECT    Wt Readings from Last 3 Encounters:  12/29/14 190 lb (86.183 kg)  11/26/14 192 lb (87.091 kg)  11/19/14 189 lb (85.73 kg)      Other studies Reviewed: Additional studies/ records that were reviewed today include: CT and echo as well as Epic notes see findings HPI.    ASSESSMENT AND PLAN:  1.  TIA- not clear of event but not likely cardiac No palpitations, normal ECG and echo  Bubble done via TTE  Don't see need for TEE or ILR Encouraged To take his ASA and Plavix  With normal CT and carotids he may have a form of migraine or seizure F/U Neuro 2. PTSS  Patient has a very unusual personality.  Has 3 brothers in town to help him  [facility]     Current medicines are reviewed at length with the patient today.  He has concerns about ASA/Plavix  Due to previous hepatitis C  The following changes have been made:  no change  Labs/ tests ordered today include: None  No orders of the defined types were placed in this encounter.     Disposition:   FU with Neuro No cardiac f/u needed        Signed, John Rouge, MD  12/29/2014 11:56 AM    [facility] [place], [place],   [address] Phone: [phone]; Fax: [phone]

## 2014-12-29 ENCOUNTER — Ambulatory Visit (INDEPENDENT_AMBULATORY_CARE_PROVIDER_SITE_OTHER): Payer: Commercial Managed Care - HMO | Admitting: Cardiovascular Disease

## 2014-12-29 ENCOUNTER — Encounter: Payer: Self-pay | Admitting: Cardiovascular Disease

## 2014-12-29 VITALS — BP 126/80 | HR 83 | Ht 73.0 in | Wt 190.0 lb

## 2014-12-29 DIAGNOSIS — G458 Other transient cerebral ischemic attacks and related syndromes: Secondary | ICD-10-CM | POA: Diagnosis not present

## 2014-12-29 NOTE — Patient Instructions (Signed)
Your physician recommends that you schedule a follow-up appointment in: AS NEEDED  Your physician recommends that you continue on your current medications as directed. Please refer to the Current Medication list given to you today.  

## 2015-01-03 ENCOUNTER — Ambulatory Visit (INDEPENDENT_AMBULATORY_CARE_PROVIDER_SITE_OTHER): Payer: Commercial Managed Care - HMO | Admitting: Neurology

## 2015-01-03 ENCOUNTER — Encounter: Payer: Self-pay | Admitting: Neurology

## 2015-01-03 VITALS — BP 130/88 | HR 74 | Resp 16 | Ht 72.0 in | Wt 198.0 lb

## 2015-01-03 DIAGNOSIS — G43109 Migraine with aura, not intractable, without status migrainosus: Secondary | ICD-10-CM

## 2015-01-03 DIAGNOSIS — G43909 Migraine, unspecified, not intractable, without status migrainosus: Secondary | ICD-10-CM | POA: Diagnosis not present

## 2015-01-03 DIAGNOSIS — R4701 Aphasia: Secondary | ICD-10-CM | POA: Diagnosis not present

## 2015-01-03 MED ORDER — TOPIRAMATE 25 MG PO TABS: ORAL_TABLET | ORAL | Status: DC

## 2015-01-03 NOTE — Patient Instructions (Addendum)
1. Keep a calendar of your symptoms 2. Starting next month, consider starting Topiramate 25mg : Take 1 tablet at bedtime for 1 week, then increase to 2 tablets at bedtime 3. Follow-up in 3 months, call our office for any changes
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
## 2015-01-07 ENCOUNTER — Encounter: Payer: Self-pay | Admitting: Neurology

## 2015-01-07 DIAGNOSIS — G43109 Migraine with aura, not intractable, without status migrainosus: Secondary | ICD-10-CM | POA: Insufficient documentation

## 2015-01-07 DIAGNOSIS — R4701 Aphasia: Secondary | ICD-10-CM | POA: Insufficient documentation

## 2015-01-27 DIAGNOSIS — D485 Neoplasm of uncertain behavior of skin: Secondary | ICD-10-CM | POA: Diagnosis not present

## 2015-01-27 DIAGNOSIS — L821 Other seborrheic keratosis: Secondary | ICD-10-CM | POA: Diagnosis not present

## 2015-01-27 DIAGNOSIS — L570 Actinic keratosis: Secondary | ICD-10-CM | POA: Diagnosis not present

## 2015-01-27 DIAGNOSIS — C44222 Squamous cell carcinoma of skin of right ear and external auricular canal: Secondary | ICD-10-CM | POA: Diagnosis not present

## 2015-04-06 ENCOUNTER — Ambulatory Visit: Payer: Commercial Managed Care - HMO | Admitting: Neurology

## 2015-08-11 DIAGNOSIS — H6000 Abscess of external ear, unspecified ear: Secondary | ICD-10-CM | POA: Diagnosis not present

## 2015-08-11 DIAGNOSIS — R5383 Other fatigue: Secondary | ICD-10-CM | POA: Diagnosis not present

## 2015-09-07 DIAGNOSIS — J069 Acute upper respiratory infection, unspecified: Secondary | ICD-10-CM | POA: Diagnosis not present

## 2015-09-07 DIAGNOSIS — H9209 Otalgia, unspecified ear: Secondary | ICD-10-CM | POA: Diagnosis not present

## 2015-11-16 DIAGNOSIS — C44221 Squamous cell carcinoma of skin of unspecified ear and external auricular canal: Secondary | ICD-10-CM | POA: Diagnosis not present

## 2015-11-16 DIAGNOSIS — M79671 Pain in right foot: Secondary | ICD-10-CM | POA: Diagnosis not present

## 2016-02-15 DIAGNOSIS — L03114 Cellulitis of left upper limb: Secondary | ICD-10-CM | POA: Diagnosis not present

## 2016-02-28 DIAGNOSIS — R238 Other skin changes: Secondary | ICD-10-CM | POA: Diagnosis not present

## 2016-02-28 DIAGNOSIS — L02414 Cutaneous abscess of left upper limb: Secondary | ICD-10-CM | POA: Diagnosis not present

## 2016-03-08 DIAGNOSIS — C44222 Squamous cell carcinoma of skin of right ear and external auricular canal: Secondary | ICD-10-CM | POA: Diagnosis not present

## 2016-07-20 DIAGNOSIS — C44529 Squamous cell carcinoma of skin of other part of trunk: Secondary | ICD-10-CM | POA: Diagnosis not present

## 2016-08-28 DIAGNOSIS — F419 Anxiety disorder, unspecified: Secondary | ICD-10-CM | POA: Diagnosis not present

## 2016-09-13 DIAGNOSIS — L570 Actinic keratosis: Secondary | ICD-10-CM | POA: Diagnosis not present

## 2016-09-13 DIAGNOSIS — D485 Neoplasm of uncertain behavior of skin: Secondary | ICD-10-CM | POA: Diagnosis not present

## 2016-09-13 DIAGNOSIS — D1801 Hemangioma of skin and subcutaneous tissue: Secondary | ICD-10-CM | POA: Diagnosis not present

## 2016-09-13 DIAGNOSIS — Z85828 Personal history of other malignant neoplasm of skin: Secondary | ICD-10-CM | POA: Diagnosis not present

## 2016-09-13 DIAGNOSIS — L814 Other melanin hyperpigmentation: Secondary | ICD-10-CM | POA: Diagnosis not present

## 2016-09-13 DIAGNOSIS — L821 Other seborrheic keratosis: Secondary | ICD-10-CM | POA: Diagnosis not present

## 2016-09-13 DIAGNOSIS — C44529 Squamous cell carcinoma of skin of other part of trunk: Secondary | ICD-10-CM | POA: Diagnosis not present

## 2016-10-30 DIAGNOSIS — D045 Carcinoma in situ of skin of trunk: Secondary | ICD-10-CM | POA: Diagnosis not present

## 2016-10-30 DIAGNOSIS — L570 Actinic keratosis: Secondary | ICD-10-CM | POA: Diagnosis not present

## 2017-02-22 DIAGNOSIS — F419 Anxiety disorder, unspecified: Secondary | ICD-10-CM | POA: Diagnosis not present

## 2017-03-10 ENCOUNTER — Encounter (HOSPITAL_COMMUNITY): Payer: Self-pay | Admitting: Emergency Medicine

## 2017-03-10 ENCOUNTER — Emergency Department (HOSPITAL_COMMUNITY)
Admission: EM | Admit: 2017-03-10 | Discharge: 2017-03-11 | Disposition: A | Discharge status: Home / Self Care | Payer: Medicare HMO | Attending: Emergency Medicine | Admitting: Emergency Medicine

## 2017-03-10 DIAGNOSIS — F17210 Nicotine dependence, cigarettes, uncomplicated: Secondary | ICD-10-CM | POA: Diagnosis not present

## 2017-03-10 DIAGNOSIS — Z7982 Long term (current) use of aspirin: Secondary | ICD-10-CM | POA: Diagnosis not present

## 2017-03-10 DIAGNOSIS — Z87891 Personal history of nicotine dependence: Secondary | ICD-10-CM | POA: Insufficient documentation

## 2017-03-10 DIAGNOSIS — R441 Visual hallucinations: Secondary | ICD-10-CM | POA: Diagnosis not present

## 2017-03-10 DIAGNOSIS — I2510 Atherosclerotic heart disease of native coronary artery without angina pectoris: Secondary | ICD-10-CM | POA: Diagnosis not present

## 2017-03-10 DIAGNOSIS — Z8673 Personal history of transient ischemic attack (TIA), and cerebral infarction without residual deficits: Secondary | ICD-10-CM | POA: Diagnosis not present

## 2017-03-10 DIAGNOSIS — F4324 Adjustment disorder with disturbance of conduct: Secondary | ICD-10-CM | POA: Diagnosis not present

## 2017-03-10 DIAGNOSIS — Z9189 Other specified personal risk factors, not elsewhere classified: Secondary | ICD-10-CM

## 2017-03-10 DIAGNOSIS — R443 Hallucinations, unspecified: Secondary | ICD-10-CM | POA: Diagnosis present

## 2017-03-10 DIAGNOSIS — R456 Violent behavior: Secondary | ICD-10-CM | POA: Diagnosis not present

## 2017-03-10 HISTORY — DX: Post-traumatic stress disorder, unspecified: F43.10

## 2017-03-10 LAB — URINALYSIS, ROUTINE W REFLEX MICROSCOPIC
BACTERIA UA: NONE SEEN
BILIRUBIN URINE: NEGATIVE
Glucose, UA: NEGATIVE mg/dL
HGB URINE DIPSTICK: NEGATIVE
Ketones, ur: 20 mg/dL — AB
LEUKOCYTES UA: NEGATIVE
NITRITE: NEGATIVE
PROTEIN: 100 mg/dL — AB
Specific Gravity, Urine: 1.006 (ref 1.005–1.030)
pH: 7 (ref 5.0–8.0)

## 2017-03-10 LAB — RAPID URINE DRUG SCREEN, HOSP PERFORMED
Amphetamines: POSITIVE — AB
Barbiturates: NOT DETECTED
Benzodiazepines: NOT DETECTED
COCAINE: NOT DETECTED
OPIATES: NOT DETECTED
Tetrahydrocannabinol: POSITIVE — AB

## 2017-03-10 MED ORDER — RISPERIDONE 0.5 MG PO TABS: 0.5000 mg | ORAL_TABLET | Freq: Every day | ORAL | Status: DC

## 2017-03-10 MED ORDER — MAGNESIUM OXIDE 400 (241.3 MG) MG PO TABS
400.0000 mg | ORAL_TABLET | Freq: Every day | ORAL | Status: DC
  Administered 2017-03-10 – 2017-03-11 (×2): 400 mg via ORAL
  Filled 2017-03-10 (×3): qty 1

## 2017-03-10 MED ORDER — ACETAMINOPHEN 325 MG PO TABS: 650.0000 mg | ORAL_TABLET | ORAL | Status: DC | PRN

## 2017-03-10 MED ORDER — ONDANSETRON HCL 4 MG PO TABS: 4.0000 mg | ORAL_TABLET | Freq: Three times a day (TID) | ORAL | Status: DC | PRN

## 2017-03-10 MED ORDER — VITAMIN C 500 MG PO TABS
500.0000 mg | ORAL_TABLET | Freq: Every day | ORAL | Status: DC
  Administered 2017-03-10 – 2017-03-11 (×2): 500 mg via ORAL
  Filled 2017-03-10 (×3): qty 1

## 2017-03-10 MED ORDER — ASPIRIN EC 81 MG PO TBEC
81.0000 mg | DELAYED_RELEASE_TABLET | Freq: Every day | ORAL | Status: DC
  Administered 2017-03-10 – 2017-03-11 (×2): 81 mg via ORAL
  Filled 2017-03-10 (×3): qty 1

## 2017-03-10 MED ORDER — VITAMIN B-12 100 MCG PO TABS
100.0000 ug | ORAL_TABLET | Freq: Every day | ORAL | Status: DC
Start: 2017-03-10 — End: 2017-03-11
  Administered 2017-03-10 – 2017-03-11 (×2): 100 ug via ORAL
  Filled 2017-03-10 (×3): qty 1

## 2017-03-10 MED ORDER — FAMOTIDINE 20 MG PO TABS
20.0000 mg | ORAL_TABLET | Freq: Every day | ORAL | Status: DC
  Administered 2017-03-10 – 2017-03-11 (×2): 20 mg via ORAL
  Filled 2017-03-10 (×2): qty 1

## 2017-03-10 MED ORDER — ALUM & MAG HYDROXIDE-SIMETH 200-200-20 MG/5ML PO SUSP
30.0000 mL | Freq: Four times a day (QID) | ORAL | Status: DC | PRN
  Administered 2017-03-10: 30 mL via ORAL
  Filled 2017-03-10: qty 30

## 2017-03-10 MED ORDER — ADULT MULTIVITAMIN W/MINERALS CH
1.0000 | ORAL_TABLET | Freq: Every day | ORAL | Status: DC
  Administered 2017-03-10 – 2017-03-11 (×2): 1 via ORAL
  Filled 2017-03-10 (×2): qty 1

## 2017-03-10 MED ORDER — OLANZAPINE 5 MG PO TBDP: 5.0000 mg | ORAL_TABLET | Freq: Three times a day (TID) | ORAL | Status: DC | PRN

## 2017-03-10 MED ORDER — ZIPRASIDONE MESYLATE 20 MG IM SOLR: 20.0000 mg | INTRAMUSCULAR | Status: DC | PRN

## 2017-03-10 MED ORDER — LORAZEPAM 1 MG PO TABS: 1.0000 mg | ORAL_TABLET | ORAL | Status: DC | PRN

## 2017-03-10 NOTE — ED Notes (Signed)
Snack and beverage given. 

## 2017-03-10 NOTE — ED Notes (Signed)
Patient refused blood draw.  Dr. Rex Kras notified.

## 2017-03-10 NOTE — ED Notes (Signed)
Patient is pleasant and cooperative.  His account of events leading to this ED admission is unchanged from the TTS report filed.  He denies SI/HI,AVH or depression/ anxiety. He was given ginger ale and saltines. POC discussed.
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
## 2017-03-10 NOTE — ED Notes (Signed)
Hourly rounding reveals patient sitting in chair reading magazine in room. No complaints, stable, in no acute distress. Q15 minute rounds and monitoring via [employer] to continue.
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
## 2017-03-10 NOTE — ED Notes (Signed)
Report to include Situation, Background, Assessment, and Recommendations received from LuAnn RN. Patient alert and oriented, warm and dry, in no acute distress. Patient denies SI, HI, AVH and pain. Patient made aware of Q15 minute rounds and security cameras for their safety. Patient instructed to come to me with needs or concerns.  

## 2017-03-10 NOTE — ED Notes (Signed)
Hourly rounding reveals patient in room. No complaints, stable, in no acute distress. Q15 minute rounds and monitoring via Security Cameras to continue. 

## 2017-03-10 NOTE — ED Notes (Signed)
Bed: [facility] Expected date: 03/10/17 Expected time:  Means of arrival:  Comments: Tcu 28

## 2017-03-10 NOTE — ED Notes (Signed)
ED Provider at bedside. 

## 2017-03-11 DIAGNOSIS — F17210 Nicotine dependence, cigarettes, uncomplicated: Secondary | ICD-10-CM

## 2017-03-11 DIAGNOSIS — F4324 Adjustment disorder with disturbance of conduct: Secondary | ICD-10-CM | POA: Diagnosis present

## 2017-03-11 MED ORDER — CLONAZEPAM 0.5 MG PO TABS
0.2500 mg | ORAL_TABLET | Freq: Once | ORAL | Status: AC
  Administered 2017-03-11: 0.25 mg via ORAL
  Filled 2017-03-11: qty 1

## 2017-03-11 NOTE — ED Notes (Signed)
Pt is sitting in his room watching TV. Pt talked to Probation officer about being in [employer] and serving in [place]. He says that he goes to the [employer] and [employer]. Pt says that he has been taking klonopin every night since 1997. TV turned on for the pt and ice water given. He is waiting on breakfast. Pt showed writer how he could touch his toes and says that he drinks healthy water. No physical complaints. Safety maintained on the unit.

## 2017-03-11 NOTE — ED Notes (Signed)
Pt being discharged home with his nephew. All items returned. D/C instructions given. Pt denies si and hi.

## 2017-03-11 NOTE — ED Notes (Signed)
Hourly rounding reveals patient in hall. No complaints, stable, in no acute distress. Q15 minute rounds and monitoring via Security Cameras to continue. 

## 2017-03-11 NOTE — BH Assessment (Signed)
[facility] Assessment Progress Note  Per Corena Pilgrim, MD, this pt does not require psychiatric hospitalization at this time.  Pt presents under IVC initiated by EDP Theotis Burrow, MD, which Dr Darleene Cleaver has rescinded.  Pt is to be discharged from [facility] with recommendation to follow up with the [facility].  This has been included in pt's discharge instructions.  Pt's nurse, Jan, has been notified.  Jalene Mullet, [facility] Triage Specialist [phone]

## 2017-03-11 NOTE — Discharge Instructions (Signed)
For your ongoing behavioral health needs, you are advised to follow up with the :       [facility]      [place], [place] [address]      [phone]

## 2017-03-11 NOTE — ED Notes (Signed)
Hourly rounding reveals patient sleeping in room. No complaints, stable, in no acute distress. Q15 minute rounds and monitoring via Security Cameras to continue. 

## 2017-03-11 NOTE — ED Notes (Signed)
Hourly rounding reveals patient in room. No complaints, stable, in no acute distress. Q15 minute rounds and monitoring via Security Cameras to continue. 

## 2017-03-11 NOTE — Care Management (Signed)
Appointment scheduled  For follow up with Dr. Dorthy Cooler  PCP at [facility] Thursday 6/7 at 2pm.
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
## 2017-03-11 NOTE — [facility] Suicide Risk Assessment (Signed)
Suicide Risk Assessment  Discharge Assessment   [facility] Discharge Suicide Risk Assessment   Principal Problem: Adjustment disorder with disturbance of conduct Discharge Diagnoses:  Patient Active Problem List   Diagnosis Date Noted  . 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 Complicated migraine [T51.761] 01/07/2015  . Expressive aphasia [R47.01] 01/07/2015  . 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 TIA (transient ischemic attack) [G45.9] 11/19/2014  . 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 [T18.108A] 03/27/2013  . Other dysphagia [R13.19] 03/27/2013    Total Time spent with patient: 45 minutes  Musculoskeletal: Strength & Muscle Tone: within normal limits Gait & Station: normal Patient leans: N/A  Psychiatric Specialty Exam: Physical Exam  Constitutional: He is oriented to person, place, and time. He appears well-developed and well-nourished.  HENT:  Head: Normocephalic.  Respiratory: Effort normal.  Musculoskeletal: Normal range of motion.  Neurological: He is alert and oriented to person, place, and time.  Psychiatric: He has a normal mood and affect. His speech is normal and behavior is normal. Judgment and thought content normal. Cognition and memory are normal.    Review of Systems  All other systems reviewed and are negative.   Blood pressure 138/78, pulse (!) 108, temperature 97.5 F (36.4 C), resp. rate 18, SpO2 100 %.There is no height or weight on file to calculate BMI.  General Appearance: Casual  Eye Contact:  Good  Speech:  Normal Rate  Volume:  Normal  Mood:  Euthymic  Affect:  Congruent  Thought Process:  Coherent and Descriptions of Associations: Intact  Orientation:  Full (Time, Place, and Person)  Thought Content:  WDL and Logical  Suicidal Thoughts:  No  Homicidal Thoughts:  No  Memory:  Immediate;   Good Recent;   Good Remote;   Good  Judgement:  Fair  Insight:  Good  Psychomotor  Activity:  Normal  Concentration:  Concentration: Good and Attention Span: Good  Recall:  Good  Fund of Knowledge:  Good  Language:  Good  Akathisia:  No  Handed:  Right  AIMS (if indicated):     Assets:  Housing Leisure Time Physical Health Resilience Social Support  ADL's:  Intact  Cognition:  WNL  Sleep:      Mental Status Per Nursing Assessment::   On Admission:   shooting at possible intruders  Demographic Factors:  Male, Age 76 or older and Caucasian  Loss Factors: NA  Historical Factors: NA  Risk Reduction Factors:   Sense of responsibility to family, Living with another person, especially a relative and Positive social support  Continued Clinical Symptoms:  None   Cognitive Features That Contribute To Risk:  None    Suicide Risk:  Minimal: No identifiable suicidal ideation.  Patients presenting with no risk factors but with morbid ruminations; may be classified as minimal risk based on the severity of the depressive symptoms    Plan Of Care/Follow-up recommendations:  Activity:  as tolerated  Diet:  heart healthy diet  Brita Jurgensen, NP 03/11/2017, 10:51 AM
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
## 2017-03-11 NOTE — Care Management (Signed)
This patient may benefit from [facility]. A referral will be placed to follow up in the community.

## 2017-03-13 ENCOUNTER — Emergency Department (HOSPITAL_COMMUNITY): Payer: Medicare HMO

## 2017-03-13 ENCOUNTER — Emergency Department (HOSPITAL_COMMUNITY)
Admission: EM | Admit: 2017-03-13 | Discharge: 2017-03-14 | Disposition: A | Discharge status: Other Acute Inpatient Hospital | Payer: Medicare HMO | Attending: Emergency Medicine | Admitting: Emergency Medicine

## 2017-03-13 DIAGNOSIS — Z96653 Presence of artificial knee joint, bilateral: Secondary | ICD-10-CM | POA: Diagnosis not present

## 2017-03-13 DIAGNOSIS — F322 Major depressive disorder, single episode, severe without psychotic features: Secondary | ICD-10-CM | POA: Insufficient documentation

## 2017-03-13 DIAGNOSIS — R45851 Suicidal ideations: Secondary | ICD-10-CM

## 2017-03-13 DIAGNOSIS — Z7982 Long term (current) use of aspirin: Secondary | ICD-10-CM | POA: Diagnosis not present

## 2017-03-13 DIAGNOSIS — F22 Delusional disorders: Secondary | ICD-10-CM | POA: Diagnosis not present

## 2017-03-13 DIAGNOSIS — F419 Anxiety disorder, unspecified: Secondary | ICD-10-CM | POA: Diagnosis not present

## 2017-03-13 DIAGNOSIS — Z87891 Personal history of nicotine dependence: Secondary | ICD-10-CM | POA: Diagnosis not present

## 2017-03-13 DIAGNOSIS — Z8673 Personal history of transient ischemic attack (TIA), and cerebral infarction without residual deficits: Secondary | ICD-10-CM | POA: Diagnosis not present

## 2017-03-13 DIAGNOSIS — I2510 Atherosclerotic heart disease of native coronary artery without angina pectoris: Secondary | ICD-10-CM | POA: Insufficient documentation

## 2017-03-13 DIAGNOSIS — Z79899 Other long term (current) drug therapy: Secondary | ICD-10-CM | POA: Diagnosis not present

## 2017-03-13 DIAGNOSIS — Z049 Encounter for examination and observation for unspecified reason: Secondary | ICD-10-CM

## 2017-03-13 DIAGNOSIS — F4312 Post-traumatic stress disorder, chronic: Secondary | ICD-10-CM | POA: Diagnosis not present

## 2017-03-13 DIAGNOSIS — G4700 Insomnia, unspecified: Secondary | ICD-10-CM | POA: Diagnosis not present

## 2017-03-13 LAB — CBC WITH DIFFERENTIAL/PLATELET
BASOS ABS: 0 10*3/uL (ref 0.0–0.1)
BASOS PCT: 0 %
EOS ABS: 0.2 10*3/uL (ref 0.0–0.7)
Eosinophils Relative: 3 %
HEMATOCRIT: 39.8 % (ref 39.0–52.0)
HEMOGLOBIN: 14.4 g/dL (ref 13.0–17.0)
Lymphocytes Relative: 33 %
Lymphs Abs: 1.7 10*3/uL (ref 0.7–4.0)
MCH: 33.2 pg (ref 26.0–34.0)
MCHC: 36.2 g/dL — AB (ref 30.0–36.0)
MCV: 91.7 fL (ref 78.0–100.0)
MONO ABS: 0.6 10*3/uL (ref 0.1–1.0)
MONOS PCT: 11 %
NEUTROS ABS: 2.8 10*3/uL (ref 1.7–7.7)
NEUTROS PCT: 53 %
Platelets: 223 10*3/uL (ref 150–400)
RBC: 4.34 MIL/uL (ref 4.22–5.81)
RDW: 13.3 % (ref 11.5–15.5)
WBC: 5.2 10*3/uL (ref 4.0–10.5)

## 2017-03-13 LAB — COMPREHENSIVE METABOLIC PANEL
ALBUMIN: 3.9 g/dL (ref 3.5–5.0)
ALK PHOS: 28 U/L — AB (ref 38–126)
ALT: 45 U/L (ref 17–63)
AST: 81 U/L — AB (ref 15–41)
Anion gap: 10 (ref 5–15)
BUN: 9 mg/dL (ref 6–20)
CALCIUM: 9 mg/dL (ref 8.9–10.3)
CHLORIDE: 102 mmol/L (ref 101–111)
CO2: 27 mmol/L (ref 22–32)
CREATININE: 0.93 mg/dL (ref 0.61–1.24)
GFR calc non Af Amer: >60 mL/min (ref >60)
GLUCOSE: 85 mg/dL (ref 65–99)
Potassium: 3.5 mmol/L (ref 3.5–5.1)
SODIUM: 139 mmol/L (ref 135–145)
Total Bilirubin: 0.7 mg/dL (ref 0.3–1.2)
Total Protein: 6.2 g/dL — ABNORMAL LOW (ref 6.5–8.1)

## 2017-03-13 LAB — URINALYSIS, ROUTINE W REFLEX MICROSCOPIC
Bilirubin Urine: NEGATIVE
GLUCOSE, UA: NEGATIVE mg/dL
HGB URINE DIPSTICK: NEGATIVE
KETONES UR: 20 mg/dL — AB
Leukocytes, UA: NEGATIVE
Nitrite: NEGATIVE
PROTEIN: NEGATIVE mg/dL
Specific Gravity, Urine: 1.006 (ref 1.005–1.030)
pH: 8 (ref 5.0–8.0)

## 2017-03-13 LAB — RAPID URINE DRUG SCREEN, HOSP PERFORMED
Amphetamines: NOT DETECTED
BARBITURATES: NOT DETECTED
Benzodiazepines: NOT DETECTED
Cocaine: NOT DETECTED
OPIATES: NOT DETECTED
TETRAHYDROCANNABINOL: POSITIVE — AB

## 2017-03-13 LAB — ACETAMINOPHEN LEVEL: Acetaminophen (Tylenol), Serum: <10 ug/mL — ABNORMAL LOW (ref 10–30)

## 2017-03-13 LAB — SALICYLATE LEVEL

## 2017-03-13 LAB — ETHANOL: Alcohol, Ethyl (B): <5 mg/dL (ref <5)

## 2017-03-13 MED ORDER — ONDANSETRON HCL 4 MG PO TABS: 4.0000 mg | ORAL_TABLET | Freq: Three times a day (TID) | ORAL | Status: DC | PRN

## 2017-03-13 MED ORDER — ASPIRIN 81 MG PO CHEW
81.0000 mg | CHEWABLE_TABLET | Freq: Every day | ORAL | Status: DC
  Administered 2017-03-14: 81 mg via ORAL
  Filled 2017-03-13: qty 1

## 2017-03-13 MED ORDER — ALUM & MAG HYDROXIDE-SIMETH 200-200-20 MG/5ML PO SUSP: 30.0000 mL | Freq: Four times a day (QID) | ORAL | Status: DC | PRN

## 2017-03-13 MED ORDER — IBUPROFEN 200 MG PO TABS
400.0000 mg | ORAL_TABLET | Freq: Three times a day (TID) | ORAL | Status: DC | PRN
  Filled 2017-03-13: qty 2

## 2017-03-13 MED ORDER — COENZYME Q-10 60 MG PO CAPS: ORAL_CAPSULE | Freq: Every day | ORAL | Status: DC

## 2017-03-13 NOTE — ED Notes (Signed)
Pt declined blood draw.  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   RN aware.
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
## 2017-03-13 NOTE — Progress Notes (Signed)
This is the patient's second visit to the ED, geriatric psychiatric hospitalization needed.  Waylan Boga, PMH-NP

## 2017-03-13 NOTE — Progress Notes (Signed)
PHARMACIST - PHYSICIAN ORDER COMMUNICATION  CONCERNING: P&T Medication Policy on Herbal Medications  DESCRIPTION:  This patient's order for: Coenzyme Q-10 has been noted.  This product(s) is classified as an "herbal" or natural product. Due to a lack of definitive safety studies or FDA approval, nonstandard manufacturing practices, plus the potential risk of unknown drug-drug interactions while on inpatient medications, the Pharmacy and Therapeutics Committee does not permit the use of "herbal" or natural products of this type within [facility].   ACTION TAKEN: The pharmacy department is unable to verify this order at this time and your patient has been informed of this safety policy. Please reevaluate patient's clinical condition at discharge and address if the herbal or natural product(s) should be resumed at that time.   Lindell Spar, PharmD, BCPS Pager: [phone] 03/13/2017 6:37 PM
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
## 2017-03-13 NOTE — ED Notes (Signed)
SBAR Report received from previous nurse. Pt received calm and visible on unit. Pt endorses current SI, depression and anxiety but denies HI, A/V H, or pain at this time, and appears otherwise stable and free of distress. Pt very wordy in his responses and contracts for safety for tonight.  Pt reminded of camera surveillance, q 15 min rounds, and rules of the milieu. Will continue to assess.

## 2017-03-13 NOTE — ED Notes (Signed)
Patients belongings placed in locker 27. Patients wallet with [phone] in cash and 4 credit cards given to security and placed in patient valuables envelope
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
## 2017-03-14 DIAGNOSIS — I2510 Atherosclerotic heart disease of native coronary artery without angina pectoris: Secondary | ICD-10-CM | POA: Diagnosis not present

## 2017-03-14 DIAGNOSIS — Z79899 Other long term (current) drug therapy: Secondary | ICD-10-CM | POA: Diagnosis not present

## 2017-03-14 DIAGNOSIS — Z96653 Presence of artificial knee joint, bilateral: Secondary | ICD-10-CM | POA: Diagnosis not present

## 2017-03-14 DIAGNOSIS — Z8673 Personal history of transient ischemic attack (TIA), and cerebral infarction without residual deficits: Secondary | ICD-10-CM | POA: Diagnosis not present

## 2017-03-14 DIAGNOSIS — F332 Major depressive disorder, recurrent severe without psychotic features: Secondary | ICD-10-CM | POA: Diagnosis not present

## 2017-03-14 DIAGNOSIS — F322 Major depressive disorder, single episode, severe without psychotic features: Secondary | ICD-10-CM

## 2017-03-14 DIAGNOSIS — Z87891 Personal history of nicotine dependence: Secondary | ICD-10-CM

## 2017-03-14 DIAGNOSIS — R45851 Suicidal ideations: Secondary | ICD-10-CM

## 2017-03-14 DIAGNOSIS — F4310 Post-traumatic stress disorder, unspecified: Secondary | ICD-10-CM | POA: Diagnosis not present

## 2017-03-14 DIAGNOSIS — G4700 Insomnia, unspecified: Secondary | ICD-10-CM | POA: Diagnosis not present

## 2017-03-14 DIAGNOSIS — F333 Major depressive disorder, recurrent, severe with psychotic symptoms: Secondary | ICD-10-CM | POA: Diagnosis not present

## 2017-03-14 DIAGNOSIS — K5900 Constipation, unspecified: Secondary | ICD-10-CM | POA: Diagnosis not present

## 2017-03-14 DIAGNOSIS — Z7982 Long term (current) use of aspirin: Secondary | ICD-10-CM | POA: Diagnosis not present

## 2017-03-14 MED ORDER — QUETIAPINE FUMARATE 25 MG PO TABS: 25.0000 mg | ORAL_TABLET | Freq: Every day | ORAL | Status: DC

## 2017-03-14 MED ORDER — CITALOPRAM HYDROBROMIDE 10 MG PO TABS: 10.0000 mg | ORAL_TABLET | Freq: Every day | ORAL | Status: DC

## 2017-03-14 MED ORDER — LORAZEPAM 0.5 MG PO TABS
0.5000 mg | ORAL_TABLET | Freq: Three times a day (TID) | ORAL | Status: DC | PRN
  Administered 2017-03-14: 0.5 mg via ORAL
  Filled 2017-03-14: qty 1

## 2017-03-14 NOTE — BH Assessment (Signed)
[facility] Assessment Progress Note  Please see note by Waylan Boga, DNP, dated 03/14/2017 @ 11:59.  Accepting physician at [facility] is Dr Dareen Piano.  Findings and Custody Order has been served.  Pt is to be transported via [facility].  Pt's nurse, Caryl Pina, has been notified.    Jalene Mullet, [place] Triage Specialist [phone]

## 2017-03-14 NOTE — Progress Notes (Signed)
Accepted to [facility], [facility] [address] [phone] for nursing report  Waylan Boga, PMHNP

## 2017-03-14 NOTE — ED Notes (Signed)
Sheriff on unit to transport pt to [facility] per MD order. Pt signed for personal property and property given to sheriff for transport. No s/s of distress, Pt ambulatory off unit in sheriff custody.

## 2017-03-14 NOTE — ED Notes (Signed)
Attempted to contact EDP again, and call bounced to ed clerk.

## 2017-03-14 NOTE — Consult Note (Signed)
[place] Psychiatry Consult   Reason for Consult:  Psychiatric evaluation Referring Physician:  EDP Patient Identification: [name] MRN:  [id number] Principal Diagnosis: Major depressive disorder, single episode, severe without psychotic features [facility]) Diagnosis:   Patient Active Problem List   Diagnosis Date Noted  . Major depressive disorder, single episode, severe without psychotic features ([place]) [F32.2] 03/13/2017    Priority: High  . 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 Expressive aphasia [R47.01] 01/07/2015  . 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 TIA (transient ischemic attack) [G45.9] 11/19/2014  . 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 [T18.108A] 03/27/2013  . Other dysphagia [R13.19] 03/27/2013    Total Time spent with patient: 45 minutes  Subjective:   [name] is a 76 y.o. male patient admitted with depression and suicidal thoughts.  HPI: Patient with history of Hepatitis C, PTSD who present to [facility] for the second time in one week due to worsening depression, suicidal thoughts with plan to jump off his garage. Patient reports that he has been stressed out and overwhelmed, he was so stressed out yesterday he called Veteran crisis line and stated that he did not want to live any more. Patient has not been compliant with medications but has been self medicating by smoking Cannabis regularly.  Past Psychiatric History: as above  Risk to Self: Suicidal Ideation: Yes-Currently Present Suicidal Intent: Yes-Currently Present Is patient at risk for suicide?: Yes Suicidal Plan?: Yes-Currently Present Specify Current Suicidal Plan:  (jump off a parking garage) Access to Means: Yes (parking garage ) Specify Access to Suicidal Means:  (patients guns were removed by the police this past weekend) What has been your use of drugs/alcohol within the last 12 months?:  (denies ) How many times?:  (0) Other Self Harm Risks:  (denies ) Triggers  for Past Attempts: Other (Comment) (no past attempts and/or gestures ) Intentional Self Injurious Behavior: None Risk to Others: Homicidal Ideation: No Thoughts of Harm to Others: No Current Homicidal Intent: No Current Homicidal Plan: No Access to Homicidal Means: No Describe Access to Homicidal Means:  (guns were removed from his home last weekend ) Identified Victim:  (denies ) History of harm to others?: No Assessment of Violence: None Noted Violent Behavior Description:  (currently calm and cooperative) Does patient have access to weapons?: No Criminal Charges Pending?: No Does patient have a court date: No Prior Inpatient Therapy: Prior Inpatient Therapy: No Prior Therapy Dates:  (n/a) Prior Therapy Facilty/Provider(s):  (n/a) Reason for Treatment:  (n/a) Prior Outpatient Therapy: Prior Outpatient Therapy: Yes Prior Therapy Dates:  ([place] is the last provider last seen 2003) Prior Therapy Facilty/Provider(s):  Shanon Brow Foster-psychiatrist-20 yrs ago; Ruebon Stein-15 yrs ag) Reason for Treatment:  (n/a) Does patient have an ACCT team?: No Does patient have Intensive In-House Services?  : No Does patient have [facility] services? : No Does patient have P4CC services?: No  Past Medical History:  Past Medical History:  Diagnosis Date  . CAD (coronary artery disease)   . Hepatitis    Hep C  . Mental disorder    PTSD  . PTSD (post-traumatic stress disorder)   . Stroke [facility])     Past Surgical History:  Procedure Laterality Date  . APPENDECTOMY    . ESOPHAGOGASTRODUODENOSCOPY N/A 03/27/2013   Procedure: ESOPHAGOGASTRODUODENOSCOPY (EGD);  Surgeon: Ladene Artist, MD;  Location: [facility];  Service: Endoscopy;  Laterality: N/A;  . FOREIGN BODY REMOVAL N/A 03/27/2013   Procedure: FOREIGN BODY REMOVAL;  Surgeon:  Ladene Artist, MD;  Location: [facility];  Service: Endoscopy;  Laterality: N/A;  . JOINT REPLACEMENT     Bilateral  . KNEE SURGERY    . TONSILLECTOMY      Family History:  Family History  Problem Relation Age of Onset  . Heart disease Father   . Deep vein thrombosis Brother   . Diabetes Brother    Family Psychiatric  History:  Social History:  History  Alcohol Use No     History  Drug Use No    Social History   Social History  . Marital status: Single    Spouse name: N/A  . Number of children: N/A  . Years of education: N/A   Social History Main Topics  . Smoking status: Former Smoker    Quit date: 10/09/1995  . Smokeless tobacco: Never Used  . Alcohol use No  . Drug use: No  . Sexual activity: Not on file   Other Topics Concern  . Not on file   Social History Narrative  . No narrative on file   Additional Social History:    Allergies:   Allergies  Allergen Reactions  . Iohexol      Code: SOB, Desc: ANAPHYLATIC SHOCK W/ IV CONTRAST/MMS     Labs:  Results for orders placed or performed during the hospital encounter of 03/13/17 (from the past 48 hour(s))  Comprehensive metabolic panel     Status: Abnormal   Collection Time: 03/13/17  4:49 PM  Result Value Ref Range   Sodium 139 135 - 145 mmol/L   Potassium 3.5 3.5 - 5.1 mmol/L   Chloride 102 101 - 111 mmol/L   CO2 27 22 - 32 mmol/L   Glucose, Bld 85 65 - 99 mg/dL   BUN 9 6 - 20 mg/dL   Creatinine, Ser 0.93 0.61 - 1.24 mg/dL   Calcium 9.0 8.9 - 10.3 mg/dL   Total Protein 6.2 (L) 6.5 - 8.1 g/dL   Albumin 3.9 3.5 - 5.0 g/dL   AST 81 (H) 15 - 41 U/L   ALT 45 17 - 63 U/L   Alkaline Phosphatase 28 (L) 38 - 126 U/L   Total Bilirubin 0.7 0.3 - 1.2 mg/dL   GFR calc non Af Amer >60 >60 mL/min   GFR calc Af Amer >60 >60 mL/min    Comment: (NOTE) The eGFR has been calculated using the CKD EPI equation. This calculation has not been validated in all clinical situations. eGFR's persistently <60 mL/min signify possible Chronic Kidney Disease.    Anion gap 10 5 - 15  Ethanol     Status: None   Collection Time: 03/13/17  4:49 PM  Result Value Ref Range    Alcohol, Ethyl (B) <5 <5 mg/dL    Comment:        LOWEST DETECTABLE LIMIT FOR SERUM ALCOHOL IS 5 mg/dL FOR MEDICAL PURPOSES ONLY   CBC with Diff     Status: Abnormal   Collection Time: 03/13/17  4:49 PM  Result Value Ref Range   WBC 5.2 4.0 - 10.5 K/uL   RBC 4.34 4.22 - 5.81 MIL/uL   Hemoglobin 14.4 13.0 - 17.0 g/dL   HCT 39.8 39.0 - 52.0 %   MCV 91.7 78.0 - 100.0 fL   MCH 33.2 26.0 - 34.0 pg   MCHC 36.2 (H) 30.0 - 36.0 g/dL   RDW 13.3 11.5 - 15.5 %   Platelets 223 150 - 400 K/uL   Neutrophils Relative % 53 %  Neutro Abs 2.8 1.7 - 7.7 K/uL   Lymphocytes Relative 33 %   Lymphs Abs 1.7 0.7 - 4.0 K/uL   Monocytes Relative 11 %   Monocytes Absolute 0.6 0.1 - 1.0 K/uL   Eosinophils Relative 3 %   Eosinophils Absolute 0.2 0.0 - 0.7 K/uL   Basophils Relative 0 %   Basophils Absolute 0.0 0.0 - 0.1 K/uL  Acetaminophen level     Status: Abnormal   Collection Time: 03/13/17  4:50 PM  Result Value Ref Range   Acetaminophen (Tylenol), Serum <10 (L) 10 - 30 ug/mL    Comment:        THERAPEUTIC CONCENTRATIONS VARY SIGNIFICANTLY. A RANGE OF 10-30 ug/mL MAY BE AN EFFECTIVE CONCENTRATION FOR MANY PATIENTS. HOWEVER, SOME ARE BEST TREATED AT CONCENTRATIONS OUTSIDE THIS RANGE. ACETAMINOPHEN CONCENTRATIONS >150 ug/mL AT 4 HOURS AFTER INGESTION AND >50 ug/mL AT 12 HOURS AFTER INGESTION ARE OFTEN ASSOCIATED WITH TOXIC REACTIONS.   Salicylate level     Status: None   Collection Time: 03/13/17  4:50 PM  Result Value Ref Range   Salicylate Lvl <7.8 2.8 - 30.0 mg/dL  Urine rapid drug screen (hosp performed)     Status: Abnormal   Collection Time: 03/13/17  4:58 PM  Result Value Ref Range   Opiates NONE DETECTED NONE DETECTED   Cocaine NONE DETECTED NONE DETECTED   Benzodiazepines NONE DETECTED NONE DETECTED   Amphetamines NONE DETECTED NONE DETECTED   Tetrahydrocannabinol POSITIVE (A) NONE DETECTED   Barbiturates NONE DETECTED NONE DETECTED    Comment:        DRUG SCREEN FOR MEDICAL  PURPOSES ONLY.  IF CONFIRMATION IS NEEDED FOR ANY PURPOSE, NOTIFY LAB WITHIN 5 DAYS.        LOWEST DETECTABLE LIMITS FOR URINE DRUG SCREEN Drug Class       Cutoff (ng/mL) Amphetamine      1000 Barbiturate      200 Benzodiazepine   588 Tricyclics       502 Opiates          300 Cocaine          300 THC              50   Urinalysis, Routine w reflex microscopic     Status: Abnormal   Collection Time: 03/13/17  4:58 PM  Result Value Ref Range   Color, Urine YELLOW YELLOW   APPearance CLEAR CLEAR   Specific Gravity, Urine 1.006 1.005 - 1.030   pH 8.0 5.0 - 8.0   Glucose, UA NEGATIVE NEGATIVE mg/dL   Hgb urine dipstick NEGATIVE NEGATIVE   Bilirubin Urine NEGATIVE NEGATIVE   Ketones, ur 20 (A) NEGATIVE mg/dL   Protein, ur NEGATIVE NEGATIVE mg/dL   Nitrite NEGATIVE NEGATIVE   Leukocytes, UA NEGATIVE NEGATIVE    Current Facility-Administered Medications  Medication Dose Route Frequency Provider Last Rate Last Dose  . alum & mag hydroxide-simeth (MAALOX/MYLANTA) 200-200-20 MG/5ML suspension 30 mL  30 mL Oral Q6H PRN Sherwood Gambler, MD      . aspirin chewable tablet 81 mg  81 mg Oral Daily Sherwood Gambler, MD   81 mg at 03/14/17 0929  . citalopram (CELEXA) tablet 10 mg  10 mg Oral Daily Jennifer Payes, MD      . ibuprofen (ADVIL,MOTRIN) tablet 400 mg  400 mg Oral Q8H PRN Sherwood Gambler, MD      . ondansetron (ZOFRAN) tablet 4 mg  4 mg Oral Q8H PRN Sherwood Gambler, MD      .  QUEtiapine (SEROQUEL) tablet 25 mg  25 mg Oral QHS Corena Pilgrim, MD       Current Outpatient Prescriptions  Medication Sig Dispense Refill  . aspirin 81 MG tablet Take 81 mg by mouth daily.    Marland Kitchen COENZYME Q-10 PO Take 1 capsule by mouth daily.      Marland Kitchen RHODIOLA ROSEA PO Take 1 capsule by mouth daily.    Marland Kitchen UNKNOWN TO PATIENT Patient states he takes a few supplements not included on the list, but he is unable to name them or their active ingredients      Musculoskeletal: Strength & Muscle Tone: within  normal limits Gait & Station: normal Patient leans: N/A  Psychiatric Specialty Exam: Physical Exam  Psychiatric: Judgment normal. His speech is delayed. He is slowed and withdrawn. Cognition and memory are normal. He exhibits a depressed mood. He expresses suicidal ideation. He expresses suicidal plans.    Review of Systems  Constitutional: Positive for malaise/fatigue.  HENT: Negative.   Eyes: Negative.   Respiratory: Negative.   Cardiovascular: Negative.   Gastrointestinal: Negative.   Genitourinary: Negative.   Musculoskeletal: Negative.   Skin: Negative.   Neurological: Negative.   Endo/Heme/Allergies: Negative.   Psychiatric/Behavioral: Positive for depression, hallucinations and suicidal ideas. The patient has insomnia.     Blood pressure 109/72, pulse 90, temperature 97.9 F (36.6 C), temperature source Oral, resp. rate 17, height 6' (1.829 m), weight 87.3 kg (192 lb 8 oz), SpO2 99 %.Body mass index is 26.11 kg/m.  General Appearance: Casual  Eye Contact:  Fair  Speech:  Clear and Coherent  Volume:  Decreased  Mood:  Depressed, Dysphoric and Hopeless  Affect:  Constricted  Thought Process:  Coherent and Descriptions of Associations: Intact  Orientation:  Full (Time, Place, and Person)  Thought Content:  Logical  Suicidal Thoughts:  Yes.  with intent/plan  Homicidal Thoughts:  No  Memory:  Immediate;   Fair Recent;   Fair Remote;   Fair  Judgement:  Poor  Insight:  Shallow  Psychomotor Activity:  Psychomotor Retardation  Concentration:  Concentration: Fair and Attention Span: Fair  Recall:  [employer] of Knowledge:  Fair  Language:  Good  Akathisia:  No  Handed:  Right  AIMS (if indicated):     Assets:  Communication Skills  ADL's:  Intact  Cognition:  WNL  Sleep:   poor     Treatment Plan Summary: Daily contact with patient to assess and evaluate symptoms and progress in treatment and Medication management  Start Celexa 10 mg daily for depression and  Seroquel 25 mg qhs for insomnia  Disposition: Recommend psychiatric Inpatient admission when medically cleared.  Corena Pilgrim, MD 03/14/2017 10:42 AM

## 2017-03-14 NOTE — ED Notes (Signed)
Pt expressing to this nurse he wants to be discharged, this nurse notified EDP Dr. Carney Bern, reports he will be on unit to see pt.

## 2017-03-14 NOTE — ED Notes (Signed)
EDP at bedside  

## 2017-03-14 NOTE — ED Notes (Signed)
Attempted EDP again.

## 2017-03-14 NOTE — ED Notes (Signed)
Pt reports to this nurse he has been taking Klonopin since 1997, this nurse notified Dr. Loni Muse.

## 2017-03-14 NOTE — ED Notes (Signed)
Pt informed writer that he would like to be discharged, now. Attempted to call EDP X2 will continue to try.

## 2017-03-14 NOTE — BH Assessment (Signed)
[name] Assessment Progress Note  Per Corena Pilgrim, MD, this pt requires psychiatric hospitalization at this time.  Dr Darleene Cleaver has also determined that pt meets criteria for IVC, which he has initiated.  IVC documents have been faxed to [facility], and at 10:53 [name] has confirmed receipt.  The following facilities have been contacted to seek placement for this pt, with results as noted:  Beds available, information sent, decision pending:  [facility] Haematologist   At capacity:  [facility] [place], [place] Triage Specialist [phone]

## 2017-03-14 NOTE — ED Notes (Signed)
Sheriff called for transport to [facility].

## 2017-03-14 NOTE — Progress Notes (Signed)
CSW spoke with patient via bedside regarding concerns he is having during this hospital stay. Patient stated "I called a number yesterday and it was a in woman in [place]. She made up lies about me and that's why im here. She lied about me and I dont need to be here. I want to go to the [place]. I had an appointment there this morning". CSW listened to patients concerns and will updated provider. Patient at this time is being recommended inpatient psychiatric hospitalization.   Kingsley Spittle, LCSWA Clinical Social Worker [phone]

## 2017-03-14 NOTE — ED Provider Notes (Signed)
Called by nurse as patient wished to leave the hospital stating "I have an appointment at the [facility] this morning" I gently persuaded patient that he needs to wait and to be evaluated by psychiatry this morning. Patient agrees   John Dakin, MD 03/14/17 [phone]

## 2017-05-28 DIAGNOSIS — F1320 Sedative, hypnotic or anxiolytic dependence, uncomplicated: Secondary | ICD-10-CM | POA: Diagnosis not present

## 2017-05-28 DIAGNOSIS — Z79899 Other long term (current) drug therapy: Secondary | ICD-10-CM | POA: Diagnosis not present

## 2017-06-20 DIAGNOSIS — R1084 Generalized abdominal pain: Secondary | ICD-10-CM | POA: Diagnosis not present

## 2017-06-20 DIAGNOSIS — F1320 Sedative, hypnotic or anxiolytic dependence, uncomplicated: Secondary | ICD-10-CM | POA: Diagnosis not present

## 2017-06-20 DIAGNOSIS — F419 Anxiety disorder, unspecified: Secondary | ICD-10-CM | POA: Diagnosis not present

## 2017-06-25 ENCOUNTER — Other Ambulatory Visit (HOSPITAL_COMMUNITY): Payer: Self-pay | Admitting: Family Medicine

## 2017-06-25 DIAGNOSIS — R1084 Generalized abdominal pain: Secondary | ICD-10-CM

## 2017-06-26 ENCOUNTER — Ambulatory Visit
Admission: RE | Admit: 2017-06-26 | Discharge: 2017-06-26 | Disposition: A | Discharge status: Home / Self Care | Payer: Medicare HMO | Source: Ambulatory Visit | Attending: Family Medicine | Admitting: Family Medicine

## 2017-06-26 ENCOUNTER — Other Ambulatory Visit: Payer: Self-pay

## 2017-06-26 ENCOUNTER — Other Ambulatory Visit: Payer: Self-pay | Admitting: Family Medicine

## 2017-06-26 DIAGNOSIS — R1084 Generalized abdominal pain: Secondary | ICD-10-CM

## 2017-06-26 DIAGNOSIS — K449 Diaphragmatic hernia without obstruction or gangrene: Secondary | ICD-10-CM | POA: Diagnosis not present

## 2018-03-12 DIAGNOSIS — R109 Unspecified abdominal pain: Secondary | ICD-10-CM | POA: Diagnosis not present

## 2018-04-07 DIAGNOSIS — R350 Frequency of micturition: Secondary | ICD-10-CM | POA: Diagnosis not present

## 2018-04-07 DIAGNOSIS — R1903 Right lower quadrant abdominal swelling, mass and lump: Secondary | ICD-10-CM | POA: Diagnosis not present

## 2018-09-09 DIAGNOSIS — K4090 Unilateral inguinal hernia, without obstruction or gangrene, not specified as recurrent: Secondary | ICD-10-CM | POA: Diagnosis not present

## 2019-05-26 IMAGING — CT CT ABD-PELV W/O CM
3 of 4 series · 12 of 36 positions shown, 19 images · non-contrast
Comparison: 07/23/2008

CLINICAL DATA: Mid abdominal pain for several months

EXAM:
CT ABDOMEN AND PELVIS WITHOUT CONTRAST
TECHNIQUE: Multidetector CT imaging of the abdomen and pelvis was performed
following the standard protocol without IV contrast.

[Series 3: abd/pelvis w/o · axial · non-contrast · 0.84mm/px · z∈[-400,-30]mm · 9 of 94 slices shown, 15 images]
[im 10/94  soft-tissue]
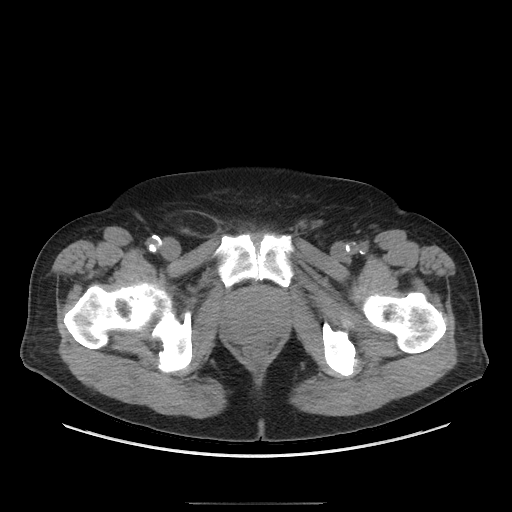
[im 10/94  bone]
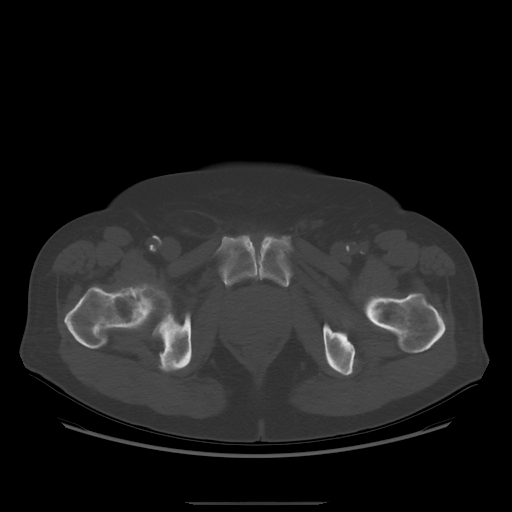
[im 19/94  soft-tissue]
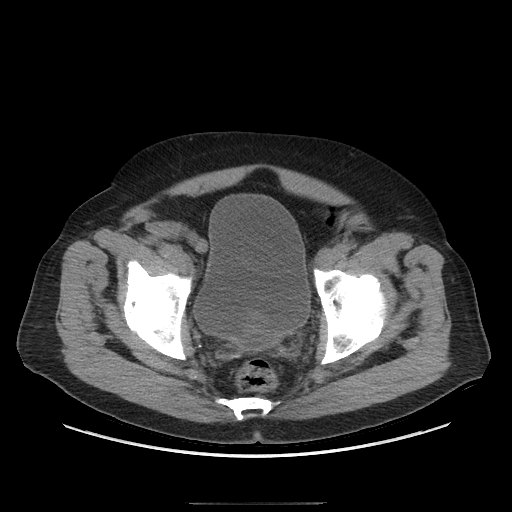
[im 28/94  soft-tissue]
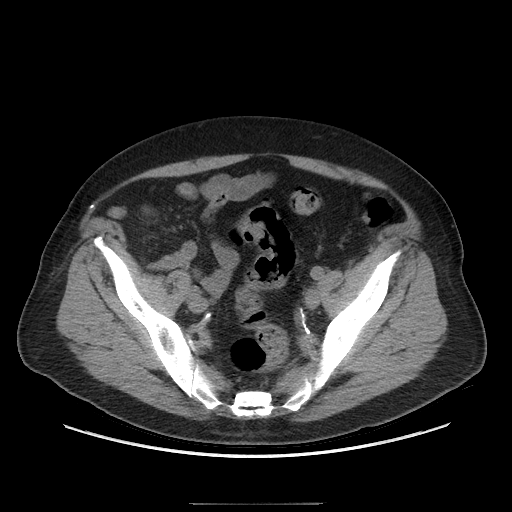
[im 38/94  soft-tissue]
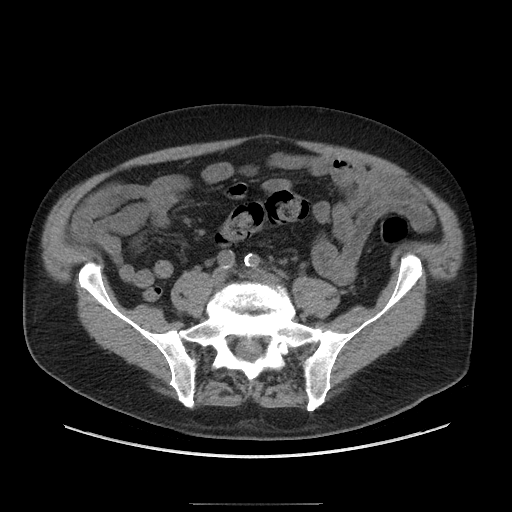
[im 47/94  soft-tissue]
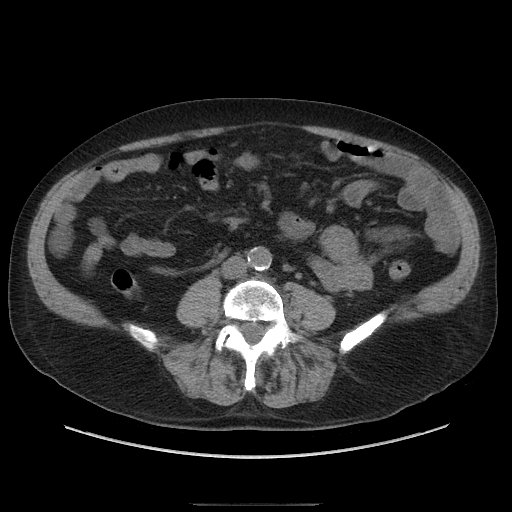
[im 56/94  soft-tissue]
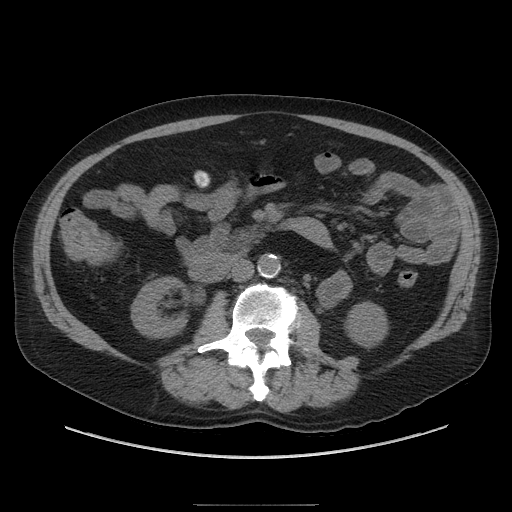
[im 56/94  lung]
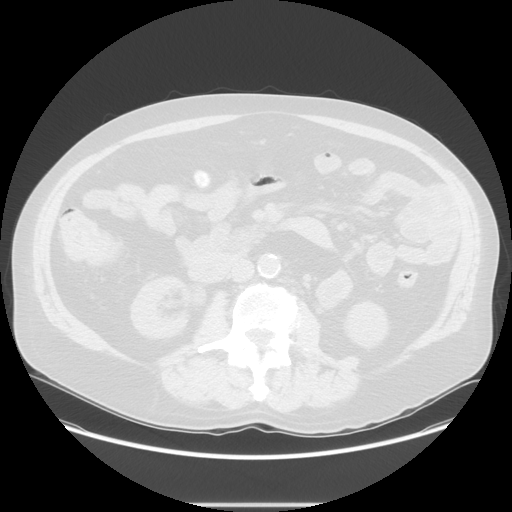
[im 66/94  soft-tissue]
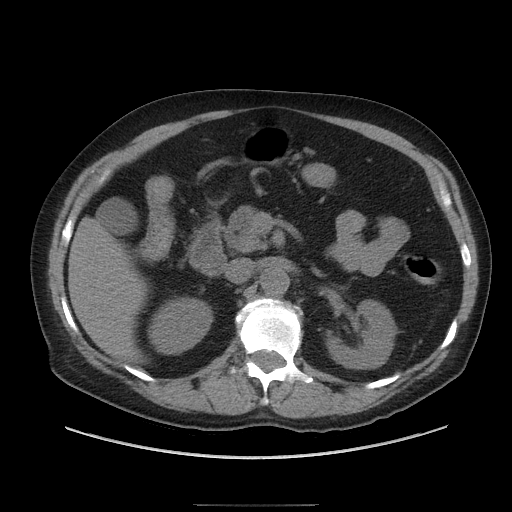
[im 66/94  lung]
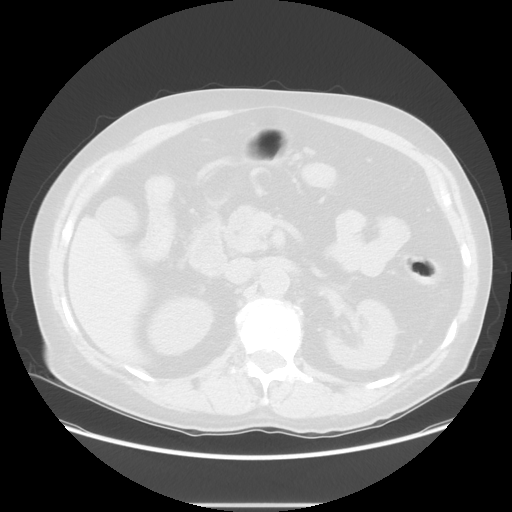
[im 75/94  soft-tissue]
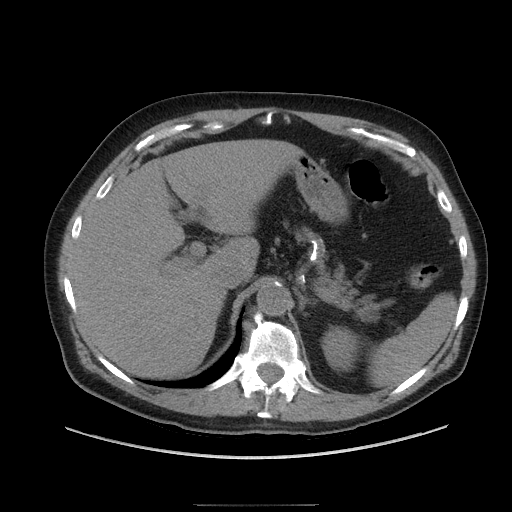
[im 75/94  lung]
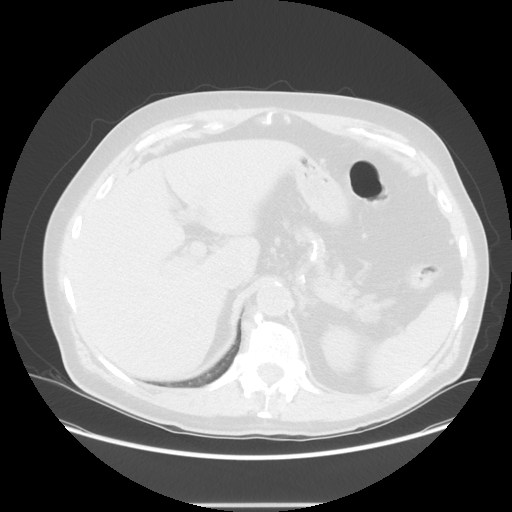
[im 84/94  soft-tissue]
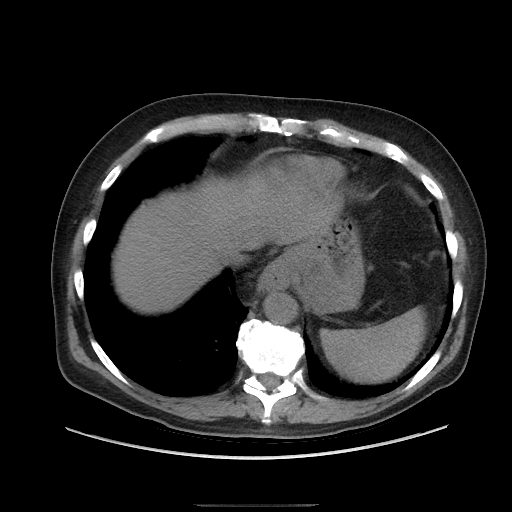
[im 84/94  lung]
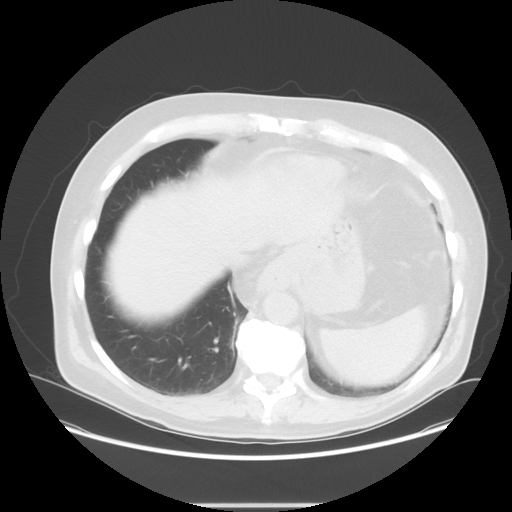
[im 84/94  bone]
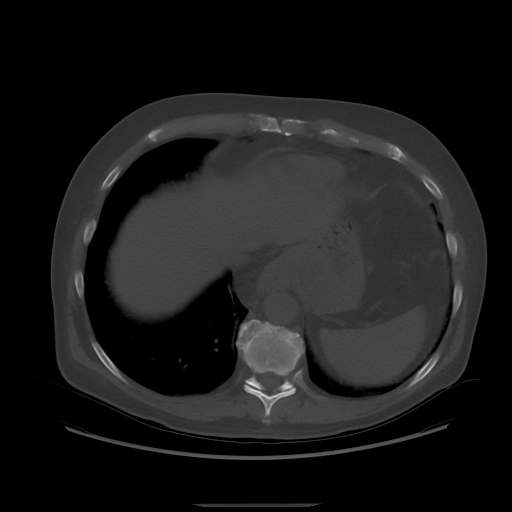

[Series 601: coronal body · coronal · 0.96mm/px · 1 of 122 slices shown, 2 images]
[im 41/122  soft-tissue]
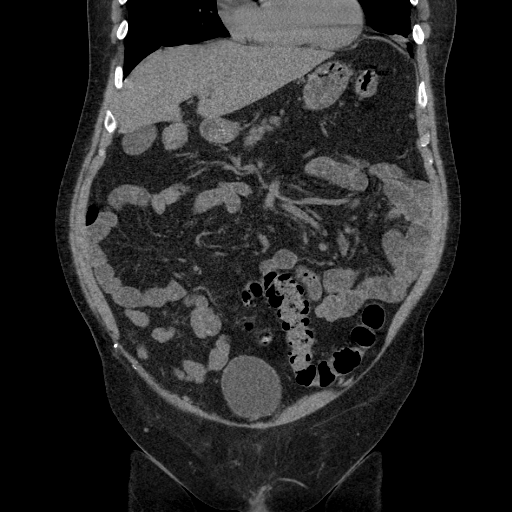
[im 41/122  bone]
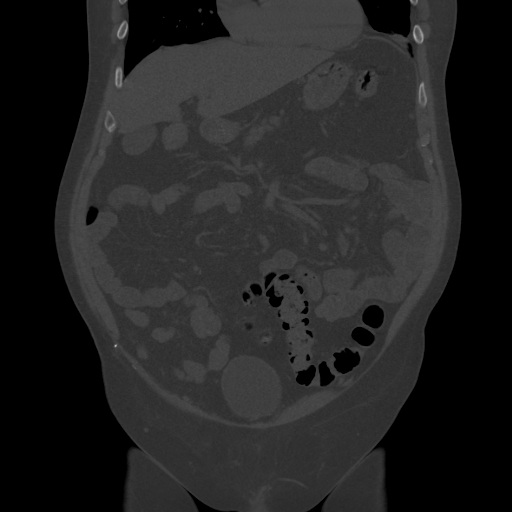

[Series 602: sagittal body · sagittal · 0.96mm/px · 2 of 167 slices shown]
[im 20/167  soft-tissue]
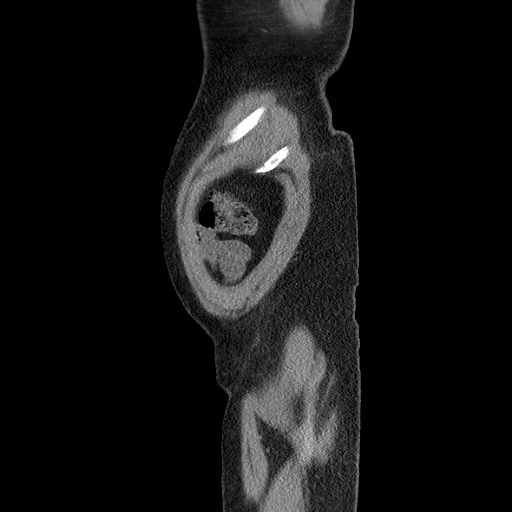
[im 40/167  soft-tissue]
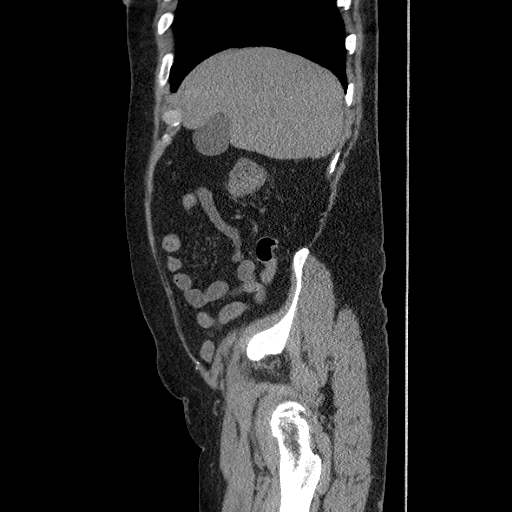

[12 of 36 positions shown; findings below may reference images not displayed]

FINDINGS: Lower chest: Lung bases are clear.  Small hiatal hernia is noted.

Hepatobiliary: No focal liver abnormality is seen. No gallstones,
gallbladder wall thickening, or biliary dilatation.

Pancreas: Unremarkable. No pancreatic ductal dilatation or
surrounding inflammatory changes.

Spleen: Normal in size without focal abnormality.

Adrenals/Urinary Tract: Adrenal glands are unremarkable. Kidneys are
normal, without renal calculi, focal lesion, or hydronephrosis.
Bladder is unremarkable.

Stomach/Bowel: The appendix has been surgically removed. No
obstructive or inflammatory changes of the bowel are seen.

Vascular/Lymphatic: Aortic atherosclerosis. No enlarged abdominal or
pelvic lymph nodes.

Reproductive: Prostate is mildly enlarged indenting upon the
inferior aspect of the urinary bladder.

Other: Fat containing right inguinal hernia is seen. No definitive
bowel is noted within. No free fluid is seen.

Musculoskeletal: Degenerative changes of lumbar spine are noted.
IMPRESSION: Small hiatal hernia.

Fat containing right inguinal hernia.

No acute abnormality noted.

## 2020-07-05 DIAGNOSIS — H6503 Acute serous otitis media, bilateral: Secondary | ICD-10-CM | POA: Diagnosis not present

## 2020-07-18 DIAGNOSIS — H6983 Other specified disorders of Eustachian tube, bilateral: Secondary | ICD-10-CM | POA: Diagnosis not present

## 2020-07-18 DIAGNOSIS — J310 Chronic rhinitis: Secondary | ICD-10-CM | POA: Diagnosis not present

## 2020-07-18 DIAGNOSIS — H6523 Chronic serous otitis media, bilateral: Secondary | ICD-10-CM | POA: Diagnosis not present

## 2021-05-15 DIAGNOSIS — M25511 Pain in right shoulder: Secondary | ICD-10-CM | POA: Diagnosis not present

## 2021-05-15 DIAGNOSIS — R0789 Other chest pain: Secondary | ICD-10-CM | POA: Diagnosis not present

## 2022-05-04 DIAGNOSIS — R5383 Other fatigue: Secondary | ICD-10-CM | POA: Diagnosis not present

## 2022-05-04 DIAGNOSIS — M1990 Unspecified osteoarthritis, unspecified site: Secondary | ICD-10-CM | POA: Diagnosis not present

## 2022-05-04 DIAGNOSIS — M719 Bursopathy, unspecified: Secondary | ICD-10-CM | POA: Diagnosis not present

## 2022-08-23 DIAGNOSIS — M25472 Effusion, left ankle: Secondary | ICD-10-CM | POA: Diagnosis not present

## 2022-09-13 ENCOUNTER — Emergency Department (HOSPITAL_COMMUNITY): Payer: Medicare HMO

## 2022-09-13 ENCOUNTER — Emergency Department (HOSPITAL_COMMUNITY)
Admission: EM | Admit: 2022-09-13 | Discharge: 2022-09-17 | Disposition: A | Discharge status: Home / Self Care | Payer: Medicare HMO | Attending: Emergency Medicine | Admitting: Emergency Medicine

## 2022-09-13 DIAGNOSIS — I6381 Other cerebral infarction due to occlusion or stenosis of small artery: Secondary | ICD-10-CM | POA: Diagnosis not present

## 2022-09-13 DIAGNOSIS — S62511A Displaced fracture of proximal phalanx of right thumb, initial encounter for closed fracture: Secondary | ICD-10-CM | POA: Diagnosis not present

## 2022-09-13 DIAGNOSIS — Z7982 Long term (current) use of aspirin: Secondary | ICD-10-CM | POA: Diagnosis not present

## 2022-09-13 DIAGNOSIS — M4312 Spondylolisthesis, cervical region: Secondary | ICD-10-CM | POA: Diagnosis not present

## 2022-09-13 DIAGNOSIS — S62514A Nondisplaced fracture of proximal phalanx of right thumb, initial encounter for closed fracture: Secondary | ICD-10-CM | POA: Diagnosis not present

## 2022-09-13 DIAGNOSIS — K5730 Diverticulosis of large intestine without perforation or abscess without bleeding: Secondary | ICD-10-CM | POA: Insufficient documentation

## 2022-09-13 DIAGNOSIS — M5136 Other intervertebral disc degeneration, lumbar region: Secondary | ICD-10-CM | POA: Insufficient documentation

## 2022-09-13 DIAGNOSIS — M25511 Pain in right shoulder: Secondary | ICD-10-CM | POA: Diagnosis not present

## 2022-09-13 DIAGNOSIS — S43002A Unspecified subluxation of left shoulder joint, initial encounter: Secondary | ICD-10-CM | POA: Diagnosis not present

## 2022-09-13 DIAGNOSIS — S63259A Unspecified dislocation of unspecified finger, initial encounter: Secondary | ICD-10-CM

## 2022-09-13 DIAGNOSIS — M25512 Pain in left shoulder: Secondary | ICD-10-CM | POA: Insufficient documentation

## 2022-09-13 DIAGNOSIS — M25522 Pain in left elbow: Secondary | ICD-10-CM | POA: Insufficient documentation

## 2022-09-13 DIAGNOSIS — M19012 Primary osteoarthritis, left shoulder: Secondary | ICD-10-CM | POA: Diagnosis not present

## 2022-09-13 DIAGNOSIS — M25519 Pain in unspecified shoulder: Secondary | ICD-10-CM | POA: Diagnosis not present

## 2022-09-13 DIAGNOSIS — M47816 Spondylosis without myelopathy or radiculopathy, lumbar region: Secondary | ICD-10-CM | POA: Insufficient documentation

## 2022-09-13 DIAGNOSIS — M189 Osteoarthritis of first carpometacarpal joint, unspecified: Secondary | ICD-10-CM | POA: Diagnosis not present

## 2022-09-13 DIAGNOSIS — J439 Emphysema, unspecified: Secondary | ICD-10-CM | POA: Diagnosis not present

## 2022-09-13 DIAGNOSIS — S6991XA Unspecified injury of right wrist, hand and finger(s), initial encounter: Secondary | ICD-10-CM | POA: Diagnosis present

## 2022-09-13 DIAGNOSIS — M19011 Primary osteoarthritis, right shoulder: Secondary | ICD-10-CM | POA: Diagnosis not present

## 2022-09-13 DIAGNOSIS — S62662A Nondisplaced fracture of distal phalanx of right middle finger, initial encounter for closed fracture: Secondary | ICD-10-CM | POA: Insufficient documentation

## 2022-09-13 DIAGNOSIS — K8689 Other specified diseases of pancreas: Secondary | ICD-10-CM | POA: Diagnosis not present

## 2022-09-13 DIAGNOSIS — I700 Atherosclerosis of aorta: Secondary | ICD-10-CM | POA: Diagnosis not present

## 2022-09-13 DIAGNOSIS — S4991XA Unspecified injury of right shoulder and upper arm, initial encounter: Secondary | ICD-10-CM | POA: Diagnosis not present

## 2022-09-13 DIAGNOSIS — R000 Tachycardia, unspecified: Secondary | ICD-10-CM | POA: Diagnosis not present

## 2022-09-13 DIAGNOSIS — I10 Essential (primary) hypertension: Secondary | ICD-10-CM | POA: Diagnosis not present

## 2022-09-13 DIAGNOSIS — K449 Diaphragmatic hernia without obstruction or gangrene: Secondary | ICD-10-CM | POA: Insufficient documentation

## 2022-09-13 DIAGNOSIS — S63262A Dislocation of metacarpophalangeal joint of right middle finger, initial encounter: Secondary | ICD-10-CM | POA: Insufficient documentation

## 2022-09-13 DIAGNOSIS — S63282A Dislocation of proximal interphalangeal joint of right middle finger, initial encounter: Secondary | ICD-10-CM | POA: Diagnosis not present

## 2022-09-13 DIAGNOSIS — S299XXA Unspecified injury of thorax, initial encounter: Secondary | ICD-10-CM | POA: Diagnosis not present

## 2022-09-13 DIAGNOSIS — R079 Chest pain, unspecified: Secondary | ICD-10-CM | POA: Diagnosis not present

## 2022-09-13 DIAGNOSIS — Z041 Encounter for examination and observation following transport accident: Secondary | ICD-10-CM | POA: Diagnosis not present

## 2022-09-13 DIAGNOSIS — S3993XA Unspecified injury of pelvis, initial encounter: Secondary | ICD-10-CM | POA: Diagnosis not present

## 2022-09-13 DIAGNOSIS — T1490XA Injury, unspecified, initial encounter: Secondary | ICD-10-CM | POA: Diagnosis not present

## 2022-09-13 LAB — LACTIC ACID, PLASMA: Lactic Acid, Venous: 2.2 mmol/L (ref 0.5–1.9)

## 2022-09-13 LAB — COMPREHENSIVE METABOLIC PANEL
ALT: 14 U/L (ref 0–44)
AST: 21 U/L (ref 15–41)
Albumin: 3.8 g/dL (ref 3.5–5.0)
Alkaline Phosphatase: 32 U/L — ABNORMAL LOW (ref 38–126)
Anion gap: 10 (ref 5–15)
BUN: 13 mg/dL (ref 8–23)
CO2: 18 mmol/L — ABNORMAL LOW (ref 22–32)
Calcium: 9.7 mg/dL (ref 8.9–10.3)
Chloride: 107 mmol/L (ref 98–111)
Creatinine, Ser: 1 mg/dL (ref 0.61–1.24)
GFR, Estimated: >60 mL/min (ref >60)
Glucose, Bld: 217 mg/dL — ABNORMAL HIGH (ref 70–99)
Potassium: 4.3 mmol/L (ref 3.5–5.1)
Sodium: 135 mmol/L (ref 135–145)
Total Bilirubin: 0.6 mg/dL (ref 0.3–1.2)
Total Protein: 6.5 g/dL (ref 6.5–8.1)

## 2022-09-13 LAB — CBC
HCT: 43.3 % (ref 39.0–52.0)
Hemoglobin: 14.6 g/dL (ref 13.0–17.0)
MCH: 31.8 pg (ref 26.0–34.0)
MCHC: 33.7 g/dL (ref 30.0–36.0)
MCV: 94.3 fL (ref 80.0–100.0)
Platelets: 268 10*3/uL (ref 150–400)
RBC: 4.59 MIL/uL (ref 4.22–5.81)
RDW: 13.2 % (ref 11.5–15.5)
WBC: 6.4 10*3/uL (ref 4.0–10.5)
nRBC: 0 % (ref 0.0–0.2)

## 2022-09-13 LAB — I-STAT CHEM 8, ED
BUN: 16 mg/dL (ref 8–23)
Calcium, Ion: 1.11 mmol/L — ABNORMAL LOW (ref 1.15–1.40)
Chloride: 106 mmol/L (ref 98–111)
Creatinine, Ser: 0.9 mg/dL (ref 0.61–1.24)
Glucose, Bld: 219 mg/dL — ABNORMAL HIGH (ref 70–99)
HCT: 43 % (ref 39.0–52.0)
Hemoglobin: 14.6 g/dL (ref 13.0–17.0)
Potassium: 4 mmol/L (ref 3.5–5.1)
Sodium: 134 mmol/L — ABNORMAL LOW (ref 135–145)
TCO2: 18 mmol/L — ABNORMAL LOW (ref 22–32)

## 2022-09-13 LAB — ETHANOL: Alcohol, Ethyl (B): <10 mg/dL (ref <10)

## 2022-09-13 LAB — TYPE AND SCREEN
ABO/RH(D): A POS
Antibody Screen: NEGATIVE

## 2022-09-13 LAB — PROTIME-INR
INR: 1.1 (ref 0.8–1.2)
Prothrombin Time: 13.8 seconds (ref 11.4–15.2)

## 2022-09-13 MED ORDER — HYDROMORPHONE HCL 1 MG/ML IJ SOLN
1.0000 mg | Freq: Once | INTRAMUSCULAR | Status: AC
  Administered 2022-09-13: 1 mg via INTRAVENOUS
  Filled 2022-09-13: qty 1

## 2022-09-13 MED ORDER — FENTANYL CITRATE PF 50 MCG/ML IJ SOSY
50.0000 ug | PREFILLED_SYRINGE | Freq: Once | INTRAMUSCULAR | Status: AC
  Administered 2022-09-13: 50 ug via INTRAVENOUS
  Filled 2022-09-13: qty 1

## 2022-09-13 MED ORDER — MORPHINE SULFATE (PF) 4 MG/ML IV SOLN: 4.0000 mg | Freq: Once | INTRAVENOUS | Status: DC
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
## 2022-09-13 NOTE — ED Notes (Signed)
XR AT BEDSIDE AT THIS TIME

## 2022-09-13 NOTE — ED Notes (Signed)
DR Debbora Dus AT BEDSIDE
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
## 2022-09-13 NOTE — ED Notes (Signed)
[name]

## 2022-09-13 NOTE — ED Notes (Signed)
Lac to left thumb per Paden RN

## 2022-09-13 NOTE — ED Notes (Signed)
Ortho tech at bedside to apply thumb spica and finger splint.

## 2022-09-13 NOTE — ED Notes (Signed)
PATIENT'S CLOTHES CUT OFF BY EMS AND BELONGINGS THAT CAME WITH PATIENT IS IN CORNER OF THE ROOM PRIOR TO GOING TO CT

## 2022-09-13 NOTE — Progress Notes (Signed)
Orthopedic Tech Progress Note Patient Details:  [name] Jan 17, 1941 [id number]  Well-padded plaster thumb spica placed to RUE and finger splint was placed on the R middle finger. R thumb wound was covered/dressed by RN prior to splinting. Encouraged elevation and icing to help with any pain/swelling.   Pt does have a laceration on his L thumb that may need to be dressed, RN made aware.   Ortho Devices Type of Ortho Device: Thumb spica splint, Finger splint Splint Material: Plaster Ortho Device/Splint Location: RUE, thumb and middle finger Ortho Device/Splint Interventions: Ordered, Adjustment, Application   Post Interventions Patient Tolerated: Well Instructions Provided: Care of device, Adjustment of device  [name] Jeri Modena 09/13/2022, 9:22 PM
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
## 2022-09-13 NOTE — ED Notes (Signed)
[facility] RN AND Medical laboratory scientific officer IN THE ROOM WITH PATIENT TO COVER CARE

## 2022-09-13 NOTE — ED Notes (Signed)
2nd paged sent

## 2022-09-13 NOTE — ED Notes (Signed)
Patient transported to CT 
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
## 2022-09-13 NOTE — Progress Notes (Signed)
   09/13/22 1710  Clinical Encounter Type  Visited With Patient not available  Visit Type Initial  Referral From Nurse  Consult/Referral To Chaplain   CH responded to the page. The patient is being treated by the medical team. There is no support person present. CH remains available for follow up support as needed. This note was prepared by Jeanine Luz, M.Div..  For questions please contact by phone [phone].

## 2022-09-14 LAB — URINALYSIS, ROUTINE W REFLEX MICROSCOPIC
Bilirubin Urine: NEGATIVE
Glucose, UA: NEGATIVE mg/dL
Hgb urine dipstick: NEGATIVE
Ketones, ur: NEGATIVE mg/dL
Leukocytes,Ua: NEGATIVE
Nitrite: NEGATIVE
Protein, ur: NEGATIVE mg/dL
Specific Gravity, Urine: 1.017 (ref 1.005–1.030)
pH: 6 (ref 5.0–8.0)

## 2022-09-14 MED ORDER — ACETAMINOPHEN 500 MG PO TABS
1000.0000 mg | ORAL_TABLET | Freq: Three times a day (TID) | ORAL | Status: DC
  Administered 2022-09-14: 500 mg via ORAL
  Administered 2022-09-14 – 2022-09-17 (×8): 1000 mg via ORAL
  Filled 2022-09-14 (×9): qty 2

## 2022-09-14 MED ORDER — TAMSULOSIN HCL 0.4 MG PO CAPS
0.4000 mg | ORAL_CAPSULE | Freq: Every day | ORAL | Status: DC
  Administered 2022-09-14 – 2022-09-16 (×3): 0.4 mg via ORAL
  Filled 2022-09-14 (×5): qty 1

## 2022-09-14 MED ORDER — APIXABAN 5 MG PO TABS
5.0000 mg | ORAL_TABLET | Freq: Two times a day (BID) | ORAL | Status: DC
  Administered 2022-09-14 – 2022-09-17 (×7): 5 mg via ORAL
  Filled 2022-09-14 (×7): qty 1

## 2022-09-14 MED ORDER — MIRTAZAPINE 15 MG PO TABS
7.5000 mg | ORAL_TABLET | Freq: Every day | ORAL | Status: DC
  Administered 2022-09-14: 7.5 mg via ORAL
  Filled 2022-09-14 (×2): qty 1

## 2022-09-14 MED ORDER — DOCUSATE SODIUM 100 MG PO CAPS
200.0000 mg | ORAL_CAPSULE | Freq: Every day | ORAL | Status: DC
  Administered 2022-09-14 – 2022-09-17 (×4): 200 mg via ORAL
  Filled 2022-09-14 (×5): qty 2

## 2022-09-14 MED ORDER — LATANOPROST 0.005 % OP SOLN
1.0000 [drp] | Freq: Every day | OPHTHALMIC | Status: DC
  Administered 2022-09-14 – 2022-09-16 (×3): 1 [drp] via OPHTHALMIC
  Filled 2022-09-14 (×2): qty 2.5

## 2022-09-14 MED ORDER — CLONAZEPAM 0.5 MG PO TABS
1.0000 mg | ORAL_TABLET | Freq: Every evening | ORAL | Status: DC | PRN
  Administered 2022-09-14 – 2022-09-16 (×3): 1 mg via ORAL
  Filled 2022-09-14 (×3): qty 2

## 2022-09-14 MED ORDER — PANTOPRAZOLE SODIUM 40 MG PO TBEC
40.0000 mg | DELAYED_RELEASE_TABLET | Freq: Every day | ORAL | Status: DC
  Administered 2022-09-14 – 2022-09-17 (×4): 40 mg via ORAL
  Filled 2022-09-14 (×4): qty 1

## 2022-09-14 MED ORDER — OXYCODONE HCL 5 MG PO TABS
5.0000 mg | ORAL_TABLET | ORAL | Status: DC | PRN
  Administered 2022-09-14 – 2022-09-17 (×12): 5 mg via ORAL
  Filled 2022-09-14 (×13): qty 1

## 2022-09-14 MED ORDER — FINASTERIDE 5 MG PO TABS
5.0000 mg | ORAL_TABLET | Freq: Every day | ORAL | Status: DC
  Administered 2022-09-14 – 2022-09-15 (×2): 5 mg via ORAL
  Filled 2022-09-14 (×5): qty 1

## 2022-09-14 NOTE — ED Notes (Signed)
Rates pain 9/10, brother arrives to visit/see brother.
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
## 2022-09-14 NOTE — ED Notes (Signed)
Pt attempted to contact [place] by phone

## 2022-09-14 NOTE — ED Notes (Signed)
Contacted SW re: TOC, SW & CM consult order for placement. [facility] is [facility] pt.

## 2022-09-14 NOTE — Progress Notes (Signed)
Pt has been faxed out to facilities and to [place] [phone].  [name], MSW, LCSW-A Pronouns:  She/Her/Hers [facility]itions of Care Clinical Social Worker Direct Number:  [phone] [name]

## 2022-09-14 NOTE — ED Notes (Signed)
PT finished at [facility]

## 2022-09-14 NOTE — ED Notes (Signed)
Speaking with brother [name] by speaker phone
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
## 2022-09-14 NOTE — ED Notes (Signed)
Chart review/RN discussion: Placement, boarder at present

## 2022-09-14 NOTE — ED Notes (Signed)
SW at BS

## 2022-09-14 NOTE — NC FL2 (Signed)
[place] LEVEL OF CARE FORM     IDENTIFICATION  Patient Name: [name] Birthdate: [birth year] Sex: male Admission Date (Current Location): 09/13/2022  [facility] and [place] Number:  Herbalist and Address:  [facility] [place], [place] [address], [place], [place] [address]      Provider Number: [id number]  Attending Physician Name and Address:  Default, Provider, MD  Relative Name and Phone Number:  Bralin Garry [phone]    Current Level of Care: Hospital Recommended Level of Care: [place] Prior Approval Number:    Date Approved/Denied:   PASRR Number: [id number]  Discharge Plan: SNF    Current Diagnoses: Patient Active Problem List   Diagnosis Date Noted   Major depressive disorder, single episode, severe without psychotic features ([place]) 97/98/9211   Complicated migraine 94/17/4081   Expressive aphasia 01/07/2015   Occlusion and stenosis of vertebral artery 11/19/2014   TIA (transient ischemic attack) 11/19/2014   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 03/27/2013   Other dysphagia 03/27/2013    Orientation RESPIRATION BLADDER Height & Weight     Self, Time, Situation, Place  Normal Continent Weight: 192 lb 7.4 oz (87.3 kg) Height:  6' (182.9 cm)  BEHAVIORAL SYMPTOMS/MOOD NEUROLOGICAL BOWEL NUTRITION STATUS      Continent Diet (Fluid consistancy)  AMBULATORY STATUS COMMUNICATION OF NEEDS Skin   Limited Assist Verbally Normal, Skin abrasions (Left and Right hand abrasions, Right middle finger splint)                       Personal Care Assistance Level of Assistance  Bathing, Feeding, Dressing Bathing Assistance: Limited assistance Feeding assistance: Independent Dressing Assistance: Limited assistance     Functional Limitations Info  Sight, Hearing, Speech Sight Info: Adequate Hearing Info: Adequate Speech Info: Adequate    SPECIAL CARE FACTORS FREQUENCY  PT (By licensed PT), OT (By licensed  OT)                    Contractures Contractures Info: Not present    Additional Factors Info  Code Status, Allergies Code Status Info: Full Code Allergies Info: Lohexol           Current Medications (09/14/2022):  This is the current hospital active medication list Current Facility-Administered Medications  Medication Dose Route Frequency Provider Last Rate Last Admin   acetaminophen (TYLENOL) tablet 1,000 mg  1,000 mg Oral Q8H Audley Hose, MD   500 mg at 09/14/22 1457   apixaban (ELIQUIS) tablet 5 mg  5 mg Oral BID Audley Hose, MD   5 mg at 09/14/22 1007   clonazePAM (KLONOPIN) tablet 1 mg  1 mg Oral QHS PRN Audley Hose, MD       docusate sodium (COLACE) capsule 200 mg  200 mg Oral Daily Audley Hose, MD   200 mg at 09/14/22 1007   finasteride (PROSCAR) tablet 5 mg  5 mg Oral Daily Audley Hose, MD   5 mg at 09/14/22 1008   latanoprost (XALATAN) 0.005 % ophthalmic solution 1 drop  1 drop Both Eyes QHS Audley Hose, MD       mirtazapine (REMERON) tablet 7.5 mg  7.5 mg Oral QHS Audley Hose, MD       oxyCODONE (Oxy IR/ROXICODONE) immediate release tablet 5 mg  5 mg Oral Q3H PRN Audley Hose, MD   5 mg at 09/14/22 1746   pantoprazole (PROTONIX) EC tablet  40 mg  40 mg Oral Daily Audley Hose, MD   40 mg at 09/14/22 1008   tamsulosin (FLOMAX) capsule 0.4 mg  0.4 mg Oral QPC supper Audley Hose, MD   0.4 mg at 09/14/22 1746   Current Outpatient Medications  Medication Sig Dispense Refill   ACIDOPHILUS LACTOBACILLUS PO Take 2 tablets by mouth daily.     apixaban (ELIQUIS) 5 MG TABS tablet Take 5 mg by mouth 2 (two) times daily.     aspirin 81 MG tablet Take 81 mg by mouth daily.     Cholecalciferol 25 MCG (1000 UT) TBDP Take 1,000 Units by mouth daily.     clonazePAM (KLONOPIN) 0.5 MG tablet Take 1.5 mg by mouth at bedtime.     COENZYME Q-10 PO Take 1 capsule by mouth daily.       docusate sodium (COLACE) 100 MG capsule Take 200 mg by mouth daily.      finasteride (PROSCAR) 5 MG tablet Take 5 mg by mouth daily.     hydrocortisone 1 % lotion Apply 1 Application topically 3 (three) times daily as needed for itching (skin rash).     latanoprost (XALATAN) 0.005 % ophthalmic solution Place 1 drop into both eyes at bedtime.     Multiple Vitamin (MULTIVITAMIN) tablet Take 1 tablet by mouth daily.     Omega-3 Fatty Acids (FISH OIL) 1000 MG CAPS Take 1,000 mg by mouth daily.     omeprazole (PRILOSEC OTC) 20 MG tablet Take 20 mg by mouth daily.     sildenafil (VIAGRA) 100 MG tablet Take 100 mg by mouth as needed for erectile dysfunction.     tamsulosin (FLOMAX) 0.4 MG CAPS capsule Take 0.4 mg by mouth daily after supper.       Discharge Medications: Please see discharge summary for a list of discharge medications.  Relevant Imaging Results:  Relevant Lab Results:   Additional Information SSN:  [id number]      Ht:  6'     Wt:  192 lbs 7.4 oz     BMI:  26.10 kg/m2  Torrance Stockley C Tarpley-Carter, LCSWA

## 2022-09-14 NOTE — ED Notes (Signed)
OT in to see, at [facility].

## 2022-09-14 NOTE — ED Notes (Signed)
Resting reclined, NAD, calm, interactive, updated. Pending OT.

## 2022-09-14 NOTE — ED Provider Notes (Signed)
Patient's home meds ordered, pain meds ordered, diet ordered.  Audley Hose, MD    Audley Hose, MD 09/14/22 [id number]

## 2022-09-14 NOTE — Progress Notes (Signed)
CSW spoke with the [place] and patient is 100% service connected with [facility]. Patients social worker is Cyndee Brightly [phone], ext [phone]. The fax to send clinicals for SNF review at the [place] is [phone]. SNF review will most likely happen on Monday. 09/17/22

## 2022-09-15 MED ORDER — METHOCARBAMOL 500 MG PO TABS
500.0000 mg | ORAL_TABLET | Freq: Three times a day (TID) | ORAL | Status: DC | PRN
  Administered 2022-09-15 – 2022-09-17 (×4): 500 mg via ORAL
  Filled 2022-09-15 (×4): qty 1
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
## 2022-09-15 NOTE — ED Notes (Signed)
Spent the last hour and 15 min with this pt.  We began with using the urinal, then he wanted to eat and expressed a desire to get cleaned up and changed.  So we got him sitting up, gave him pain meds and he ate breakfast. I cut up his french toast and sausage and opened his milk and oatmeal but he managed to eat it all with his left hand.  Afterward I made a basin of soapy water and he washed his face and privates.  He was still pretty stiff so and uncomfortable so I helped him with part of it but he was trying.  He was able to get up and sit in a chair with a towel on it (with 1-person assit) to clean up. We changed his bed.    Before he got back into bed he wanted to move about so we walked around the nurses station twice, then he wanted to do a little PT so at his instruction and guidance we stretched his arms and shoulders out by slowly lifting them and making small circles.  We did this 2-3 times.  He then bent over and touched his toes gingerly doing a little bounce.  After this he did some modified squats.  He was very pleasant throughout and seems to really want to improve his mobility and gain his physical independence as much as possible. He asked if we could do this 2-3 times a day.  I stated that I couldn't speak for everyone but I would try to do it again for him unless PT came and worked with him and he was good.   I was not expecting this kind of effort based on report but I was very impressed.

## 2022-09-15 NOTE — ED Notes (Signed)
Patient is alseep  in bed.
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
## 2022-09-15 NOTE — ED Notes (Signed)
Performed dressing change to abrasions on left arm.

## 2022-09-15 NOTE — ED Notes (Signed)
Patient was able to take medication.Complains that pain is generally 10/10. Medications were given.

## 2022-09-15 NOTE — ED Notes (Signed)
Assumed care of patient. Patient is laying in bed.
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
## 2022-09-16 ENCOUNTER — Encounter (HOSPITAL_COMMUNITY): Payer: Self-pay

## 2022-09-16 LAB — BASIC METABOLIC PANEL
Anion gap: 10 (ref 5–15)
BUN: 22 mg/dL (ref 8–23)
CO2: 20 mmol/L — ABNORMAL LOW (ref 22–32)
Calcium: 9.5 mg/dL (ref 8.9–10.3)
Chloride: 106 mmol/L (ref 98–111)
Creatinine, Ser: 1.14 mg/dL (ref 0.61–1.24)
GFR, Estimated: >60 mL/min (ref >60)
Glucose, Bld: 180 mg/dL — ABNORMAL HIGH (ref 70–99)
Potassium: 3.8 mmol/L (ref 3.5–5.1)
Sodium: 136 mmol/L (ref 135–145)

## 2022-09-16 LAB — CK: Total CK: 496 U/L — ABNORMAL HIGH (ref 49–397)

## 2022-09-16 MED ORDER — POLYETHYLENE GLYCOL 3350 17 G PO PACK
17.0000 g | PACK | Freq: Every day | ORAL | Status: DC
  Administered 2022-09-16: 17 g via ORAL
  Filled 2022-09-16 (×2): qty 1

## 2022-09-16 NOTE — ED Notes (Signed)
Pt was ambulating around nurses station independently.
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
## 2022-09-16 NOTE — ED Notes (Signed)
This RN assisted pt out of bed. Pt ambulated with standby assist, pt walked around nurses station and out in the hallway and then ambulated to the restroom. Pt assisted back to room and currently sitting up in the recliner.

## 2022-09-16 NOTE — ED Notes (Signed)
Patient ambulated to the bathroom w/o staff assistance; Patient able to to move all extremities on his own at this time-Monique,RN

## 2022-09-16 NOTE — ED Notes (Signed)
Pt refused finasteride, pt states he does not take this medication, he takes flomax. Pt educated that pt has taken finasteride for the past two days daily in the AM, and takes flomax in the evenings. Pt states he does not want to take this medication.

## 2022-09-16 NOTE — ED Notes (Signed)
Patient was readjusted in bed.  

## 2022-09-17 NOTE — ED Notes (Signed)
Occupational therapy at bedside.

## 2022-09-17 NOTE — ED Notes (Signed)
Pt asked for Chaplain and bible. Unable to reach Chaplain at this time. Chaplain paged.
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
## 2022-09-17 NOTE — ED Notes (Signed)
Chaplain at bedside
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
## 2022-09-17 NOTE — ED Notes (Signed)
Pt ambulated to bathroom without any assistance with a shuffling gait.
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
## 2022-09-17 NOTE — ED Notes (Signed)
Meal tray provided to patient for lunch
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
## 2022-09-17 NOTE — ED Notes (Signed)
Pt was waiting to be assigned to a SNF with social work currently working on that. Pt was discharged home with his brother [name].

## 2022-09-17 NOTE — Discharge Planning (Signed)
Erika Hussar J. Clydene Laming, RN, BSN, [place] [phone] RNCM spoke with pt at bedside regarding discharge planning for [name]. Offered pt medicare.gov list of home health agencies to choose from.  Pt chose [facility] to render PT and OT services. Hoyle Sauer of [facility] notified. Patient made aware that [facility] will be in contact in 24-48 hours.  No DME needs identified at this time.

## 2022-09-17 NOTE — Progress Notes (Signed)
CSW contacted patients social worker, Cyndee Brightly at the [facility], [place], [place]. Delana Meyer is out of the office until 09/19/22. CSW left a message with Harriet Pho, ext [phone] who is covering for [facility].

## 2022-10-19 DIAGNOSIS — F4310 Post-traumatic stress disorder, unspecified: Secondary | ICD-10-CM | POA: Diagnosis not present

## 2022-10-19 DIAGNOSIS — I82409 Acute embolism and thrombosis of unspecified deep veins of unspecified lower extremity: Secondary | ICD-10-CM | POA: Diagnosis not present

## 2022-10-19 DIAGNOSIS — Z79899 Other long term (current) drug therapy: Secondary | ICD-10-CM | POA: Diagnosis not present

## 2022-10-19 DIAGNOSIS — E559 Vitamin D deficiency, unspecified: Secondary | ICD-10-CM | POA: Diagnosis not present

## 2022-10-19 DIAGNOSIS — E119 Type 2 diabetes mellitus without complications: Secondary | ICD-10-CM | POA: Diagnosis not present

## 2022-10-20 DIAGNOSIS — H338 Other retinal detachments: Secondary | ICD-10-CM | POA: Diagnosis not present

## 2022-10-20 DIAGNOSIS — B1920 Unspecified viral hepatitis C without hepatic coma: Secondary | ICD-10-CM | POA: Diagnosis not present

## 2022-10-20 DIAGNOSIS — M1990 Unspecified osteoarthritis, unspecified site: Secondary | ICD-10-CM | POA: Diagnosis not present

## 2022-10-20 DIAGNOSIS — K5900 Constipation, unspecified: Secondary | ICD-10-CM | POA: Diagnosis not present

## 2022-10-20 DIAGNOSIS — F419 Anxiety disorder, unspecified: Secondary | ICD-10-CM | POA: Diagnosis not present

## 2022-10-20 DIAGNOSIS — G894 Chronic pain syndrome: Secondary | ICD-10-CM | POA: Diagnosis not present

## 2022-10-20 DIAGNOSIS — K219 Gastro-esophageal reflux disease without esophagitis: Secondary | ICD-10-CM | POA: Diagnosis not present

## 2022-10-20 DIAGNOSIS — K580 Irritable bowel syndrome with diarrhea: Secondary | ICD-10-CM | POA: Diagnosis not present

## 2022-10-20 DIAGNOSIS — F4310 Post-traumatic stress disorder, unspecified: Secondary | ICD-10-CM | POA: Diagnosis not present

## 2022-10-22 DIAGNOSIS — F419 Anxiety disorder, unspecified: Secondary | ICD-10-CM | POA: Diagnosis not present

## 2022-10-22 DIAGNOSIS — K219 Gastro-esophageal reflux disease without esophagitis: Secondary | ICD-10-CM | POA: Diagnosis not present

## 2022-10-22 DIAGNOSIS — G894 Chronic pain syndrome: Secondary | ICD-10-CM | POA: Diagnosis not present

## 2022-10-22 DIAGNOSIS — M1990 Unspecified osteoarthritis, unspecified site: Secondary | ICD-10-CM | POA: Diagnosis not present

## 2022-10-22 DIAGNOSIS — S62600D Fracture of unspecified phalanx of right index finger, subsequent encounter for fracture with routine healing: Secondary | ICD-10-CM | POA: Diagnosis not present

## 2022-10-25 DIAGNOSIS — S62600D Fracture of unspecified phalanx of right index finger, subsequent encounter for fracture with routine healing: Secondary | ICD-10-CM | POA: Diagnosis not present

## 2022-10-25 DIAGNOSIS — G894 Chronic pain syndrome: Secondary | ICD-10-CM | POA: Diagnosis not present

## 2022-10-25 DIAGNOSIS — M1990 Unspecified osteoarthritis, unspecified site: Secondary | ICD-10-CM | POA: Diagnosis not present

## 2022-10-25 DIAGNOSIS — F419 Anxiety disorder, unspecified: Secondary | ICD-10-CM | POA: Diagnosis not present

## 2022-10-25 DIAGNOSIS — K219 Gastro-esophageal reflux disease without esophagitis: Secondary | ICD-10-CM | POA: Diagnosis not present

## 2022-10-27 DIAGNOSIS — F4310 Post-traumatic stress disorder, unspecified: Secondary | ICD-10-CM | POA: Diagnosis not present

## 2022-10-27 DIAGNOSIS — N400 Enlarged prostate without lower urinary tract symptoms: Secondary | ICD-10-CM | POA: Diagnosis not present

## 2022-10-27 DIAGNOSIS — G894 Chronic pain syndrome: Secondary | ICD-10-CM | POA: Diagnosis not present

## 2022-10-27 DIAGNOSIS — K219 Gastro-esophageal reflux disease without esophagitis: Secondary | ICD-10-CM | POA: Diagnosis not present

## 2022-10-27 DIAGNOSIS — K580 Irritable bowel syndrome with diarrhea: Secondary | ICD-10-CM | POA: Diagnosis not present

## 2022-10-27 DIAGNOSIS — F419 Anxiety disorder, unspecified: Secondary | ICD-10-CM | POA: Diagnosis not present

## 2022-10-27 DIAGNOSIS — K5900 Constipation, unspecified: Secondary | ICD-10-CM | POA: Diagnosis not present

## 2022-10-27 DIAGNOSIS — R296 Repeated falls: Secondary | ICD-10-CM | POA: Diagnosis not present

## 2022-10-27 DIAGNOSIS — M1990 Unspecified osteoarthritis, unspecified site: Secondary | ICD-10-CM | POA: Diagnosis not present

## 2022-10-30 DIAGNOSIS — U071 COVID-19: Secondary | ICD-10-CM | POA: Diagnosis not present

## 2022-10-30 DIAGNOSIS — G894 Chronic pain syndrome: Secondary | ICD-10-CM | POA: Diagnosis not present

## 2022-10-30 DIAGNOSIS — S62600D Fracture of unspecified phalanx of right index finger, subsequent encounter for fracture with routine healing: Secondary | ICD-10-CM | POA: Diagnosis not present

## 2022-10-30 DIAGNOSIS — M1990 Unspecified osteoarthritis, unspecified site: Secondary | ICD-10-CM | POA: Diagnosis not present

## 2022-10-30 DIAGNOSIS — F419 Anxiety disorder, unspecified: Secondary | ICD-10-CM | POA: Diagnosis not present

## 2022-11-01 DIAGNOSIS — U071 COVID-19: Secondary | ICD-10-CM | POA: Diagnosis not present

## 2022-11-01 DIAGNOSIS — G894 Chronic pain syndrome: Secondary | ICD-10-CM | POA: Diagnosis not present

## 2022-11-01 DIAGNOSIS — F419 Anxiety disorder, unspecified: Secondary | ICD-10-CM | POA: Diagnosis not present

## 2022-11-01 DIAGNOSIS — M1990 Unspecified osteoarthritis, unspecified site: Secondary | ICD-10-CM | POA: Diagnosis not present

## 2022-11-01 DIAGNOSIS — K219 Gastro-esophageal reflux disease without esophagitis: Secondary | ICD-10-CM | POA: Diagnosis not present

## 2022-11-01 DIAGNOSIS — S62600D Fracture of unspecified phalanx of right index finger, subsequent encounter for fracture with routine healing: Secondary | ICD-10-CM | POA: Diagnosis not present

## 2022-11-08 DIAGNOSIS — S62600D Fracture of unspecified phalanx of right index finger, subsequent encounter for fracture with routine healing: Secondary | ICD-10-CM | POA: Diagnosis not present

## 2022-11-08 DIAGNOSIS — F419 Anxiety disorder, unspecified: Secondary | ICD-10-CM | POA: Diagnosis not present

## 2022-11-08 DIAGNOSIS — G894 Chronic pain syndrome: Secondary | ICD-10-CM | POA: Diagnosis not present

## 2022-11-08 DIAGNOSIS — M1990 Unspecified osteoarthritis, unspecified site: Secondary | ICD-10-CM | POA: Diagnosis not present

## 2022-11-11 DIAGNOSIS — F419 Anxiety disorder, unspecified: Secondary | ICD-10-CM | POA: Diagnosis not present

## 2022-11-11 DIAGNOSIS — S62600D Fracture of unspecified phalanx of right index finger, subsequent encounter for fracture with routine healing: Secondary | ICD-10-CM | POA: Diagnosis not present

## 2022-11-11 DIAGNOSIS — G894 Chronic pain syndrome: Secondary | ICD-10-CM | POA: Diagnosis not present

## 2022-11-11 DIAGNOSIS — K219 Gastro-esophageal reflux disease without esophagitis: Secondary | ICD-10-CM | POA: Diagnosis not present

## 2022-11-11 DIAGNOSIS — M1990 Unspecified osteoarthritis, unspecified site: Secondary | ICD-10-CM | POA: Diagnosis not present

## 2022-11-11 DIAGNOSIS — U071 COVID-19: Secondary | ICD-10-CM | POA: Diagnosis not present

## 2022-11-11 DIAGNOSIS — R296 Repeated falls: Secondary | ICD-10-CM | POA: Diagnosis not present

## 2022-11-11 DIAGNOSIS — K5900 Constipation, unspecified: Secondary | ICD-10-CM | POA: Diagnosis not present

## 2022-11-11 DIAGNOSIS — F4310 Post-traumatic stress disorder, unspecified: Secondary | ICD-10-CM | POA: Diagnosis not present

## 2022-11-14 DIAGNOSIS — M1990 Unspecified osteoarthritis, unspecified site: Secondary | ICD-10-CM | POA: Diagnosis not present

## 2022-11-14 DIAGNOSIS — S62600D Fracture of unspecified phalanx of right index finger, subsequent encounter for fracture with routine healing: Secondary | ICD-10-CM | POA: Diagnosis not present

## 2022-11-14 DIAGNOSIS — G894 Chronic pain syndrome: Secondary | ICD-10-CM | POA: Diagnosis not present

## 2022-11-14 DIAGNOSIS — F419 Anxiety disorder, unspecified: Secondary | ICD-10-CM | POA: Diagnosis not present

## 2022-11-29 DIAGNOSIS — Z0001 Encounter for general adult medical examination with abnormal findings: Secondary | ICD-10-CM | POA: Diagnosis not present

## 2022-11-29 DIAGNOSIS — R7309 Other abnormal glucose: Secondary | ICD-10-CM | POA: Diagnosis not present

## 2022-11-29 DIAGNOSIS — D6869 Other thrombophilia: Secondary | ICD-10-CM | POA: Diagnosis not present

## 2022-11-29 DIAGNOSIS — I700 Atherosclerosis of aorta: Secondary | ICD-10-CM | POA: Diagnosis not present

## 2022-11-29 DIAGNOSIS — E119 Type 2 diabetes mellitus without complications: Secondary | ICD-10-CM | POA: Diagnosis not present

## 2022-11-29 DIAGNOSIS — I82502 Chronic embolism and thrombosis of unspecified deep veins of left lower extremity: Secondary | ICD-10-CM | POA: Diagnosis not present

## 2022-11-29 DIAGNOSIS — R7301 Impaired fasting glucose: Secondary | ICD-10-CM | POA: Diagnosis not present

## 2022-11-29 DIAGNOSIS — J439 Emphysema, unspecified: Secondary | ICD-10-CM | POA: Diagnosis not present

## 2022-11-29 DIAGNOSIS — Z79899 Other long term (current) drug therapy: Secondary | ICD-10-CM | POA: Diagnosis not present

## 2023-03-12 ENCOUNTER — Emergency Department (HOSPITAL_COMMUNITY)
Admission: EM | Admit: 2023-03-12 | Discharge: 2023-03-13 | Disposition: A | Discharge status: Home / Self Care | Payer: No Typology Code available for payment source | Attending: Emergency Medicine | Admitting: Emergency Medicine

## 2023-03-12 ENCOUNTER — Other Ambulatory Visit: Payer: Self-pay

## 2023-03-12 ENCOUNTER — Emergency Department (HOSPITAL_COMMUNITY): Payer: No Typology Code available for payment source

## 2023-03-12 ENCOUNTER — Encounter (HOSPITAL_COMMUNITY): Payer: Self-pay

## 2023-03-12 DIAGNOSIS — S199XXA Unspecified injury of neck, initial encounter: Secondary | ICD-10-CM | POA: Diagnosis not present

## 2023-03-12 DIAGNOSIS — I2510 Atherosclerotic heart disease of native coronary artery without angina pectoris: Secondary | ICD-10-CM | POA: Insufficient documentation

## 2023-03-12 DIAGNOSIS — W010XXA Fall on same level from slipping, tripping and stumbling without subsequent striking against object, initial encounter: Secondary | ICD-10-CM | POA: Diagnosis not present

## 2023-03-12 DIAGNOSIS — K449 Diaphragmatic hernia without obstruction or gangrene: Secondary | ICD-10-CM | POA: Diagnosis not present

## 2023-03-12 DIAGNOSIS — I6381 Other cerebral infarction due to occlusion or stenosis of small artery: Secondary | ICD-10-CM | POA: Diagnosis not present

## 2023-03-12 DIAGNOSIS — Z8673 Personal history of transient ischemic attack (TIA), and cerebral infarction without residual deficits: Secondary | ICD-10-CM | POA: Diagnosis not present

## 2023-03-12 DIAGNOSIS — Z7982 Long term (current) use of aspirin: Secondary | ICD-10-CM | POA: Diagnosis not present

## 2023-03-12 DIAGNOSIS — Z7901 Long term (current) use of anticoagulants: Secondary | ICD-10-CM | POA: Insufficient documentation

## 2023-03-12 DIAGNOSIS — R519 Headache, unspecified: Secondary | ICD-10-CM | POA: Diagnosis not present

## 2023-03-12 DIAGNOSIS — Z87891 Personal history of nicotine dependence: Secondary | ICD-10-CM | POA: Diagnosis not present

## 2023-03-12 DIAGNOSIS — R1010 Upper abdominal pain, unspecified: Secondary | ICD-10-CM | POA: Diagnosis present

## 2023-03-12 DIAGNOSIS — N400 Enlarged prostate without lower urinary tract symptoms: Secondary | ICD-10-CM | POA: Diagnosis not present

## 2023-03-12 DIAGNOSIS — S2242XA Multiple fractures of ribs, left side, initial encounter for closed fracture: Secondary | ICD-10-CM | POA: Diagnosis not present

## 2023-03-12 LAB — COMPREHENSIVE METABOLIC PANEL
ALT: 15 U/L (ref 0–44)
AST: 16 U/L (ref 15–41)
Albumin: 3.9 g/dL (ref 3.5–5.0)
Alkaline Phosphatase: 31 U/L — ABNORMAL LOW (ref 38–126)
Anion gap: 9 (ref 5–15)
BUN: 15 mg/dL (ref 8–23)
CO2: 22 mmol/L (ref 22–32)
Calcium: 9.6 mg/dL (ref 8.9–10.3)
Chloride: 104 mmol/L (ref 98–111)
Creatinine, Ser: 0.91 mg/dL (ref 0.61–1.24)
GFR, Estimated: >60 mL/min (ref >60)
Glucose, Bld: 109 mg/dL — ABNORMAL HIGH (ref 70–99)
Potassium: 3.7 mmol/L (ref 3.5–5.1)
Sodium: 135 mmol/L (ref 135–145)
Total Bilirubin: 0.6 mg/dL (ref 0.3–1.2)
Total Protein: 6.9 g/dL (ref 6.5–8.1)

## 2023-03-12 LAB — PROTIME-INR
INR: 1 (ref 0.8–1.2)
Prothrombin Time: 13.1 seconds (ref 11.4–15.2)

## 2023-03-12 LAB — I-STAT CHEM 8, ED
BUN: 13 mg/dL (ref 8–23)
Calcium, Ion: 1.31 mmol/L (ref 1.15–1.40)
Chloride: 103 mmol/L (ref 98–111)
Creatinine, Ser: 0.8 mg/dL (ref 0.61–1.24)
Glucose, Bld: 107 mg/dL — ABNORMAL HIGH (ref 70–99)
HCT: 43 % (ref 39.0–52.0)
Hemoglobin: 14.6 g/dL (ref 13.0–17.0)
Potassium: 3.8 mmol/L (ref 3.5–5.1)
Sodium: 137 mmol/L (ref 135–145)
TCO2: 26 mmol/L (ref 22–32)

## 2023-03-12 LAB — CBC
HCT: 43 % (ref 39.0–52.0)
Hemoglobin: 14.4 g/dL (ref 13.0–17.0)
MCH: 30.5 pg (ref 26.0–34.0)
MCHC: 33.5 g/dL (ref 30.0–36.0)
MCV: 91.1 fL (ref 80.0–100.0)
Platelets: 194 10*3/uL (ref 150–400)
RBC: 4.72 MIL/uL (ref 4.22–5.81)
RDW: 16.9 % — ABNORMAL HIGH (ref 11.5–15.5)
WBC: 5.2 10*3/uL (ref 4.0–10.5)
nRBC: 0 % (ref 0.0–0.2)

## 2023-03-12 LAB — LACTIC ACID, PLASMA: Lactic Acid, Venous: 0.9 mmol/L (ref 0.5–1.9)

## 2023-03-12 LAB — SAMPLE TO BLOOD BANK

## 2023-03-12 LAB — ETHANOL: Alcohol, Ethyl (B): <10 mg/dL (ref <10)

## 2023-03-12 MED ORDER — MORPHINE SULFATE (PF) 4 MG/ML IV SOLN
4.0000 mg | Freq: Once | INTRAVENOUS | Status: AC
  Administered 2023-03-12: 4 mg via INTRAVENOUS
  Filled 2023-03-12: qty 1

## 2023-03-12 MED ORDER — OXYCODONE HCL 5 MG PO TABS: 5.0000 mg | ORAL_TABLET | Freq: Four times a day (QID) | ORAL | 0 refills | Status: DC | PRN

## 2023-03-12 NOTE — Discharge Instructions (Addendum)
We evaluated you after your fall.  Your CT scans showed that you have 2 rib fractures.  They did not see any signs of a collapsed lung, bleeding, or injury inside your abdomen.  We have given you an incentive spirometer.  Please try to use this every few hours to help expand your lungs and prevent pneumonia.  Please take at 1000 mg of Tylenol every 6 hours for pain.  If your pain is unrelieved with Tylenol you can take oxycodone.  Do not drink alcohol or drive while taking this medication as it may make you sleepy.  Please follow-up closely with your primary doctor.  If you have any worsening symptoms or new symptoms such as severe abdominal pain, vomiting, difficulty breathing, worsening chest pain, vomiting, headaches, lightheadedness, or any other new symptoms, please return to the emergency department for reassessment.

## 2023-03-12 NOTE — ED Triage Notes (Signed)
Patient arrived POV. Patient reports fell last night at home on abdomen. Denies head injury or LOC. States went to [place] urgent care today and was advised to come to ED to get evaluated.  Pt reports left side rib pain, unable to take a full breath. Not on blood thinners per pt

## 2023-03-12 NOTE — ED Notes (Signed)
Results of iSTAT Chem 8 were printed and manually handed to Dr.Scheving due to an intranet network issue.

## 2023-03-12 NOTE — ED Provider Notes (Signed)
[place] EMERGENCY DEPARTMENT AT [facility] Provider Note  CSN: [id number] Arrival date & time: 03/12/23 1647  Chief Complaint(s) Fall  HPI [name] is a 82 y.o. male history of coronary artery disease, hepatitis, PTSD presenting to the emergency department with fall.  Patient reports that he fell last night, had a ground-level fall, tripped on something at home and landed on his belly.  Does not think he hit his head.  No headaches.  No nausea or vomiting.  Reports some upper abdominal pain and pain on the left side with deep breaths.  No fevers or chills.  No loss of consciousness.  No pain in arms or legs.  He went to [place] urgent care today where they diagnosed him with multiple rib fractures, advised him to come to the hospital.  He reports "I am here for narcotics" and also reports that he does not want to stay in the hospital overnight.   Past Medical History Past Medical History:  Diagnosis Date   CAD (coronary artery disease)    Hepatitis    Hep C   Mental disorder    PTSD   PTSD (post-traumatic stress disorder)    Stroke [facility])    Patient Active Problem List   Diagnosis Date Noted   Major depressive disorder, single episode, severe without psychotic features (HCC) 03/13/2017   Complicated migraine 01/07/2015   Expressive aphasia 01/07/2015   Occlusion and stenosis of vertebral artery 11/19/2014   TIA (transient ischemic attack) 11/19/2014   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 03/27/2013   Other dysphagia 03/27/2013   Home Medication(s) Prior to Admission medications   Medication Sig Start Date End Date Taking? Authorizing Provider  oxyCODONE (ROXICODONE) 5 MG immediate release tablet Take 1 tablet (5 mg total) by mouth every 6 (six) hours as needed for severe pain. 03/12/23  Yes Lonell Grandchild, MD  ACIDOPHILUS LACTOBACILLUS PO Take 2 tablets by mouth daily.    [provider]  apixaban (ELIQUIS) 5 MG TABS tablet Take 5 mg by mouth 2 (two) times daily.     [provider]  aspirin 81 MG tablet Take 81 mg by mouth daily.    [provider]  Cholecalciferol 25 MCG (1000 UT) TBDP Take 1,000 Units by mouth daily.    [provider]  clonazePAM (KLONOPIN) 0.5 MG tablet Take 1.5 mg by mouth at bedtime.    [provider]  COENZYME Q-10 PO Take 1 capsule by mouth daily.      [provider]  docusate sodium (COLACE) 100 MG capsule Take 200 mg by mouth daily.    [provider]  finasteride (PROSCAR) 5 MG tablet Take 5 mg by mouth daily.    [provider]  hydrocortisone 1 % lotion Apply 1 Application topically 3 (three) times daily as needed for itching (skin rash).    [provider]  latanoprost (XALATAN) 0.005 % ophthalmic solution Place 1 drop into both eyes at bedtime.    [provider]  Multiple Vitamin (MULTIVITAMIN) tablet Take 1 tablet by mouth daily.    [provider]  Omega-3 Fatty Acids (FISH OIL) 1000 MG CAPS Take 1,000 mg by mouth daily.    [provider]  omeprazole (PRILOSEC OTC) 20 MG tablet Take 20 mg by mouth daily.    [provider]  sildenafil (VIAGRA) 100 MG tablet Take 100 mg by mouth as needed for erectile dysfunction.    [provider]  tamsulosin (FLOMAX) 0.4 MG  CAPS capsule Take 0.4 mg by mouth daily after supper.    [provider]                                                                                                                                    Past Surgical History Past Surgical History:  Procedure Laterality Date   APPENDECTOMY     ESOPHAGOGASTRODUODENOSCOPY N/A 03/27/2013   Procedure: ESOPHAGOGASTRODUODENOSCOPY (EGD);  Surgeon: Meryl Dare, MD;  Location: [facility];  Service: Endoscopy;  Laterality: N/A;   FOREIGN BODY REMOVAL N/A 03/27/2013   Procedure: FOREIGN BODY REMOVAL;  Surgeon: Meryl Dare, MD;  Location: [facility] END[facility]COPY;  Service: Endoscopy;  Laterality: N/A;    JOINT REPLACEMENT     Bilateral   KNEE SURGERY     TONSILLECTOMY     Family History Family History  Problem Relation Age of Onset   Heart disease Father    Deep vein thrombosis Brother    Diabetes Brother     Social History Social History   Tobacco Use   Smoking status: Former    Types: Cigarettes    Quit date: 10/09/1995    Years since quitting: 27.4   Smokeless tobacco: Never  Substance Use Topics   Alcohol use: No    Alcohol/week: 0.0 standard drinks of alcohol   Drug use: No   Allergies Iohexol  Review of Systems Review of Systems  All other systems reviewed and are negative.   Physical Exam Vital Signs  I have reviewed the triage vital signs BP 124/88   Pulse 86   Temp 97.7 F (36.5 C) (Oral)   Resp 17   Ht 6' (1.829 m)   Wt 88 kg   SpO2 95%   BMI 26.31 kg/m  Physical Exam Vitals and nursing note reviewed.  Constitutional:      General: He is not in acute distress.    Appearance: Normal appearance.  HENT:     Head: Normocephalic and atraumatic.     Mouth/Throat:     Mouth: Mucous membranes are moist.  Eyes:     Conjunctiva/sclera: Conjunctivae normal.  Cardiovascular:     Rate and Rhythm: Normal rate and regular rhythm.  Pulmonary:     Effort: Pulmonary effort is normal. No respiratory distress.     Breath sounds: Normal breath sounds.  Abdominal:     General: Abdomen is flat.     Palpations: Abdomen is soft.     Tenderness: There is abdominal tenderness (LUQ).  Musculoskeletal:     Right lower leg: No edema.     Left lower leg: No edema.     Comments: Small amount of bruising to the left lower chest wall with mild left chest wall tenderness without crepitus.  No midline C, T, L-spine tenderness.  Full active range of motion of the bilateral upper and lower extremities without focal tenderness or abnormality.  Patient ambulatory with normal  gait  Skin:    General: Skin is warm and dry.     Capillary Refill: Capillary refill takes less  than 2 seconds.  Neurological:     Mental Status: He is alert and oriented to person, place, and time. Mental status is at baseline.  Psychiatric:        Mood and Affect: Mood normal.        Behavior: Behavior normal.     ED Results and Treatments Labs (all labs ordered are listed, but only abnormal results are displayed) Labs Reviewed  COMPREHENSIVE METABOLIC PANEL - Abnormal; Notable for the following components:      Result Value   Glucose, Bld 109 (*)    Alkaline Phosphatase 31 (*)    All other components within normal limits  CBC - Abnormal; Notable for the following components:   RDW 16.9 (*)    All other components within normal limits  I-STAT CHEM 8, ED - Abnormal; Notable for the following components:   Glucose, Bld 107 (*)    All other components within normal limits  ETHANOL  LACTIC ACID, PLASMA  PROTIME-INR  SAMPLE TO BLOOD BANK  TYPE AND SCREEN                                                                                                                          Radiology CT CHEST ABDOMEN PELVIS WO CONTRAST  Result Date: 03/12/2023 CLINICAL DATA:  Fall at home onto abdomen. Left rib pain. Unable to take full breath. EXAM: CT CHEST, ABDOMEN AND PELVIS WITHOUT CONTRAST TECHNIQUE: Multidetector CT imaging of the chest, abdomen and pelvis was performed following the standard protocol without IV contrast. RADIATION DOSE REDUCTION: This exam was performed according to the departmental dose-optimization program which includes automated exposure control, adjustment of the mA and/or kV according to patient size and/or use of iterative reconstruction technique. COMPARISON:  CT chest abdomen and pelvis 09/13/2022 FINDINGS: CT CHEST FINDINGS Cardiovascular: Normal heart size. Coronary artery and aortic atherosclerotic calcification. No pericardial effusion. Mediastinum/Nodes: No intramural hematoma. Moderate hiatal hernia. Esophagus is otherwise unremarkable. No thoracic  adenopathy. 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 Bibasilar atelectasis/scarring. Centrilobular micro nodules and tree-in-bud opacities in the left upper and lower lobes. No pleural effusion or pneumothorax. Musculoskeletal: Acute mildly displaced fractures of the left lateral seventh and eighth ribs. CT ABDOMEN PELVIS FINDINGS Hepatobiliary: No hepatic injury or perihepatic hematoma. Gallbladder is unremarkable Pancreas: Unremarkable.  Lipoma in the pancreatic head. Spleen: No splenic injury or perisplenic hematoma. Adrenals/Urinary Tract: No adrenal hemorrhage or renal injury identified. Bladder is unremarkable. Stomach/Bowel: Normal caliber large and small bowel. No bowel wall thickening. No free intraperitoneal fluid or air. Vascular/Lymphatic: Aortic atherosclerosis. No enlarged abdominal or pelvic lymph nodes. Reproductive: Enlarged prostate. Other: No abdominal wall hernia. Musculoskeletal: No acute fracture in the abdomen or pelvis. IMPRESSION: 1. Acute mildly displaced fractures of the left lateral seventh and eighth ribs. No pneumothorax. 2. No evidence of acute injury in the  abdomen or pelvis. 3. Centrilobular micro nodules and tree-in-bud opacities in the left upper and lower lobes compatible with small airway infection/inflammation and/or aspiration. 4. Moderate hiatal hernia. 5. Enlarged prostate. Aortic Atherosclerosis (ICD10-I70.0). Electronically Signed   By: Minerva Fester M.D.   On: 03/12/2023 22:58   CT CERVICAL SPINE WO CONTRAST  Result Date: 03/12/2023 CLINICAL DATA:  Poly trauma, blunt.  Patient fell last night. EXAM: CT CERVICAL SPINE WITHOUT CONTRAST TECHNIQUE: Multidetector CT imaging of the cervical spine was performed without intravenous contrast. Multiplanar CT image reconstructions were also generated. RADIATION DOSE REDUCTION: This exam was performed according to the departmental dose-optimization program which includes automated exposure control, adjustment of the  mA and/or kV according to patient size and/or use of iterative reconstruction technique. COMPARISON:  09/13/2022 FINDINGS: Alignment: Normal. Skull base and vertebrae: No acute fracture. No primary bone lesion or focal pathologic process. Soft tissues and spinal canal: No prevertebral fluid or swelling. No visible canal hematoma. Disc levels: Degenerative changes throughout with disc space narrowing and endplate osteophyte formation. Mild degenerative changes in the facet joints. Uncovertebral spurring causes some encroachment upon the neural foramina bilaterally. Upper chest: Emphysematous changes in the lung apices. Vascular calcifications. Other: None. IMPRESSION: Normal alignment. No acute displaced fractures. Moderate diffuse degenerative changes. Electronically Signed   By: Burman Nieves M.D.   On: 03/12/2023 22:53   CT HEAD WO CONTRAST  Result Date: 03/12/2023 CLINICAL DATA:  Recent fall with headaches, initial encounter EXAM: CT HEAD WITHOUT CONTRAST TECHNIQUE: Contiguous axial images were obtained from the base of the skull through the vertex without intravenous contrast. RADIATION DOSE REDUCTION: This exam was performed according to the departmental dose-optimization program which includes automated exposure control, adjustment of the mA and/or kV according to patient size and/or use of iterative reconstruction technique. COMPARISON:  09/13/2022 FINDINGS: Brain: No evidence of acute infarction, hemorrhage, hydrocephalus, extra-axial collection or mass lesion/mass effect. Old lacunar infarct is noted within the right basal ganglia. Mild atrophic changes are noted commensurate with the patient's given age. Vascular: No hyperdense vessel or unexpected calcification. Skull: Normal. Negative for fracture or focal lesion. Sinuses/Orbits: No acute finding. Other: None. IMPRESSION: Chronic changes without acute abnormality. Electronically Signed   By: Alcide Clever M.D.   On: 03/12/2023 22:52    Pertinent  labs & imaging results that were available during my care of the patient were reviewed by me and considered in my medical decision making (see MDM for details).  Medications Ordered in ED Medications  morphine (PF) 4 MG/ML injection 4 mg (4 mg Intravenous Given 03/12/23 2257)                                                                                                                                     Procedures Procedures  (including critical care time)  Medical Decision Making / ED Course   MDM:  82 year old male presenting to the emergency department after  ground-level fall.  [facility].  Does show rib fractures.  Lungs equal and exam, no respiratory distress.  Patient exam also notable for some left upper quadrant tenderness.  Although was a ground-level fall, given pain in the chest and abdomen, will obtain CT chest abdomen pelvis to further evaluate for other acute injuries such as intra-abdominal trauma, pneumothorax.  Patient did not hit his head but given age will obtain head CT and CT cervical spine as well to complete traumatic workup.  Will check basic labs.  Will give pain medication.  Patient very reluctant to be admitted to the hospital for pain control, but discussed that we will obtain his imaging and if he has any other dangerous process we may strongly recommend he stay in the hospital. If workup only demonstrates rib fractures without pneumothorax will likely be safe for discharge with oral pain control and IS  Clinical Course as of 03/13/23 1522  Wed Mar 13, 2023  1519 CT scan with 2 rib fractures without pneumothorax. Some tree and bud nodules in lungs but lungs clear, no cough, doubt acute infection. Pain controlled. No intraabdominal, intracranial or spinal trauma. Patient ambulatory. Patient does not want to be admitted for pain control. Will discharge with oxycodone and tylenol as well as IS. [place] Controlled  Substance Reporting System database was reviewed. and patient was instructed, not to drive, operate heavy machinery, perform activities at heights, swimming or participation in water activities or provide baby-sitting services while on Pain, Sleep and Anxiety Medications; until their outpatient Physician has advised to do so again. Also recommended to not to take more than prescribed Pain, Sleep and Anxiety Medications. Will discharge patient to home. All questions answered. Patient comfortable with plan of discharge. Return precautions discussed with patient and specified on the after visit summary.  [WS]    Clinical Course User Index [WS] Lonell Grandchild, MD     Additional history obtained:  -External records from outside source obtained and reviewed including: Chart review including previous notes, labs, imaging, consultation notes including [facility]   Lab Tests: -I ordered, reviewed, and interpreted labs.   The pertinent results include:   Labs Reviewed  COMPREHENSIVE METABOLIC PANEL - Abnormal; Notable for the following components:      Result Value   Glucose, Bld 109 (*)    Alkaline Phosphatase 31 (*)    All other components within normal limits  CBC - Abnormal; Notable for the following components:   RDW 16.9 (*)    All other components within normal limits  I-STAT CHEM 8, ED - Abnormal; Notable for the following components:   Glucose, Bld 107 (*)    All other components within normal limits  ETHANOL  LACTIC ACID, PLASMA  PROTIME-INR  SAMPLE TO BLOOD BANK  TYPE AND SCREEN    Notable for no leukocytosis, no anemia  Imaging Studies ordered: I ordered imaging studies including CT head/neck, C/A/P On my interpretation imaging demonstrates 2 rib fractures ,no pneumothorax  I independently visualized and interpreted imaging. I agree with the radiologist interpretation   Medicines ordered and prescription drug management: Meds ordered this encounter  Medications    morphine (PF) 4 MG/ML injection 4 mg   oxyCODONE (ROXICODONE) 5 MG immediate release tablet    Sig: Take 1 tablet (5 mg total) by mouth every 6 (six) hours as needed for severe pain.    Dispense:  15 tablet    Refill:  0    -  I have reviewed the patients home medicines and have made adjustments as needed   Cardiac Monitoring: The patient was maintained on a cardiac monitor.  I personally viewed and interpreted the cardiac monitored which showed an underlying rhythm of: NSR  Social Determinants of Health:  Diagnosis or treatment significantly limited by social determinants of health: lives alone   Reevaluation: After the interventions noted above, I reevaluated the patient and found that their symptoms have improved  Co morbidities that complicate the patient evaluation  Past Medical History:  Diagnosis Date   CAD (coronary artery disease)    Hepatitis    Hep C   Mental disorder    PTSD   PTSD (post-traumatic stress disorder)    Stroke [facility])       Dispostion: Disposition decision including need for hospitalization was considered, and patient discharged from emergency department.    Final Clinical Impression(s) / ED Diagnoses Final diagnoses:  Closed fracture of multiple ribs of left side, initial encounter     This chart was dictated using voice recognition software.  Despite best efforts to proofread,  errors can occur which can change the documentation meaning.    Lonell Grandchild, MD 03/13/23 [phone]

## 2023-03-13 LAB — TYPE AND SCREEN
ABO/RH(D): A POS
Antibody Screen: NEGATIVE

## 2023-03-13 NOTE — ED Notes (Addendum)
Patient educated on how to use Incentive spirometer

## 2023-03-15 DIAGNOSIS — S2239XA Fracture of one rib, unspecified side, initial encounter for closed fracture: Secondary | ICD-10-CM | POA: Diagnosis not present

## 2023-03-15 DIAGNOSIS — R0789 Other chest pain: Secondary | ICD-10-CM | POA: Diagnosis not present

## 2023-03-15 DIAGNOSIS — Z0000 Encounter for general adult medical examination without abnormal findings: Secondary | ICD-10-CM | POA: Diagnosis not present

## 2023-03-19 ENCOUNTER — Telehealth: Payer: Self-pay

## 2023-03-19 NOTE — Telephone Encounter (Signed)
Transition Care Management Follow-up Telephone Call Date of discharge and from where: 03/13/2023 [facility] How have you been since you were released from the hospital? Patient is feeling better but still sore. Any questions or concerns? No  Items Reviewed: Did the pt receive and understand the discharge instructions provided? Yes  Medications obtained and verified? Yes  Other? No  Any new allergies since your discharge? No  Dietary orders reviewed? Yes Do you have support at home? Yes   Follow up appointments reviewed:  PCP Hospital f/u appt confirmed? Yes  Scheduled to see Dibas Docia Chuck, MD on 03/15/2023 @ [facility] [place]. Specialist Hospital f/u appt confirmed? No  Scheduled to see  on  @ . Are transportation arrangements needed? No  If their condition worsens, is the pt aware to call PCP or go to the Emergency Dept.? Yes Was the patient provided with contact information for the PCP's office or ED? Yes Was to pt encouraged to call back with questions or concerns? Yes  [name]  [facility] [facility] Population Health Community Resource Care Guide   [name]  ?? [phone]   Website: [email or web address]  [email or web address]

## 2023-10-23 DIAGNOSIS — I82502 Chronic embolism and thrombosis of unspecified deep veins of left lower extremity: Secondary | ICD-10-CM | POA: Diagnosis not present

## 2023-10-23 DIAGNOSIS — I82402 Acute embolism and thrombosis of unspecified deep veins of left lower extremity: Secondary | ICD-10-CM | POA: Diagnosis not present

## 2023-10-23 DIAGNOSIS — E119 Type 2 diabetes mellitus without complications: Secondary | ICD-10-CM | POA: Diagnosis not present

## 2023-10-23 DIAGNOSIS — J439 Emphysema, unspecified: Secondary | ICD-10-CM | POA: Diagnosis not present

## 2024-01-05 ENCOUNTER — Emergency Department (HOSPITAL_BASED_OUTPATIENT_CLINIC_OR_DEPARTMENT_OTHER)

## 2024-01-05 ENCOUNTER — Inpatient Hospital Stay (HOSPITAL_BASED_OUTPATIENT_CLINIC_OR_DEPARTMENT_OTHER)
Admission: EM | Admit: 2024-01-05 | Discharge: 2024-01-14 | DRG: 603 | Disposition: A | Discharge status: Home Health | Attending: Internal Medicine | Admitting: Internal Medicine

## 2024-01-05 ENCOUNTER — Emergency Department (HOSPITAL_BASED_OUTPATIENT_CLINIC_OR_DEPARTMENT_OTHER): Admitting: Radiology

## 2024-01-05 ENCOUNTER — Other Ambulatory Visit: Payer: Self-pay

## 2024-01-05 ENCOUNTER — Encounter (HOSPITAL_BASED_OUTPATIENT_CLINIC_OR_DEPARTMENT_OTHER): Payer: Self-pay

## 2024-01-05 DIAGNOSIS — B1920 Unspecified viral hepatitis C without hepatic coma: Secondary | ICD-10-CM | POA: Diagnosis present

## 2024-01-05 DIAGNOSIS — K7460 Unspecified cirrhosis of liver: Secondary | ICD-10-CM

## 2024-01-05 DIAGNOSIS — I82402 Acute embolism and thrombosis of unspecified deep veins of left lower extremity: Secondary | ICD-10-CM | POA: Diagnosis present

## 2024-01-05 DIAGNOSIS — Z1152 Encounter for screening for COVID-19: Secondary | ICD-10-CM | POA: Diagnosis not present

## 2024-01-05 DIAGNOSIS — I82432 Acute embolism and thrombosis of left popliteal vein: Secondary | ICD-10-CM | POA: Diagnosis not present

## 2024-01-05 DIAGNOSIS — E876 Hypokalemia: Secondary | ICD-10-CM | POA: Diagnosis not present

## 2024-01-05 DIAGNOSIS — Z471 Aftercare following joint replacement surgery: Secondary | ICD-10-CM | POA: Diagnosis not present

## 2024-01-05 DIAGNOSIS — R531 Weakness: Secondary | ICD-10-CM | POA: Diagnosis not present

## 2024-01-05 DIAGNOSIS — Z91041 Radiographic dye allergy status: Secondary | ICD-10-CM

## 2024-01-05 DIAGNOSIS — Z86718 Personal history of other venous thrombosis and embolism: Secondary | ICD-10-CM | POA: Diagnosis not present

## 2024-01-05 DIAGNOSIS — Z7982 Long term (current) use of aspirin: Secondary | ICD-10-CM

## 2024-01-05 DIAGNOSIS — F4310 Post-traumatic stress disorder, unspecified: Secondary | ICD-10-CM | POA: Diagnosis present

## 2024-01-05 DIAGNOSIS — S81802A Unspecified open wound, left lower leg, initial encounter: Secondary | ICD-10-CM | POA: Diagnosis not present

## 2024-01-05 DIAGNOSIS — Z8673 Personal history of transient ischemic attack (TIA), and cerebral infarction without residual deficits: Secondary | ICD-10-CM

## 2024-01-05 DIAGNOSIS — F419 Anxiety disorder, unspecified: Secondary | ICD-10-CM | POA: Diagnosis not present

## 2024-01-05 DIAGNOSIS — E871 Hypo-osmolality and hyponatremia: Secondary | ICD-10-CM | POA: Diagnosis present

## 2024-01-05 DIAGNOSIS — L03116 Cellulitis of left lower limb: Secondary | ICD-10-CM | POA: Diagnosis not present

## 2024-01-05 DIAGNOSIS — Z87891 Personal history of nicotine dependence: Secondary | ICD-10-CM | POA: Diagnosis not present

## 2024-01-05 DIAGNOSIS — I824Y2 Acute embolism and thrombosis of unspecified deep veins of left proximal lower extremity: Secondary | ICD-10-CM | POA: Diagnosis not present

## 2024-01-05 DIAGNOSIS — Z86711 Personal history of pulmonary embolism: Secondary | ICD-10-CM

## 2024-01-05 DIAGNOSIS — Z7901 Long term (current) use of anticoagulants: Secondary | ICD-10-CM | POA: Diagnosis not present

## 2024-01-05 DIAGNOSIS — Z79899 Other long term (current) drug therapy: Secondary | ICD-10-CM

## 2024-01-05 DIAGNOSIS — Z8249 Family history of ischemic heart disease and other diseases of the circulatory system: Secondary | ICD-10-CM | POA: Diagnosis not present

## 2024-01-05 DIAGNOSIS — I2510 Atherosclerotic heart disease of native coronary artery without angina pectoris: Secondary | ICD-10-CM | POA: Diagnosis present

## 2024-01-05 DIAGNOSIS — Z833 Family history of diabetes mellitus: Secondary | ICD-10-CM | POA: Diagnosis not present

## 2024-01-05 DIAGNOSIS — I878 Other specified disorders of veins: Secondary | ICD-10-CM | POA: Diagnosis present

## 2024-01-05 DIAGNOSIS — M7989 Other specified soft tissue disorders: Secondary | ICD-10-CM | POA: Diagnosis present

## 2024-01-05 DIAGNOSIS — Z96652 Presence of left artificial knee joint: Secondary | ICD-10-CM | POA: Diagnosis not present

## 2024-01-05 DIAGNOSIS — R509 Fever, unspecified: Secondary | ICD-10-CM | POA: Diagnosis present

## 2024-01-05 DIAGNOSIS — Z91148 Patient's other noncompliance with medication regimen for other reason: Secondary | ICD-10-CM

## 2024-01-05 DIAGNOSIS — Z888 Allergy status to other drugs, medicaments and biological substances status: Secondary | ICD-10-CM

## 2024-01-05 DIAGNOSIS — R000 Tachycardia, unspecified: Secondary | ICD-10-CM | POA: Diagnosis present

## 2024-01-05 DIAGNOSIS — M792 Neuralgia and neuritis, unspecified: Secondary | ICD-10-CM | POA: Diagnosis present

## 2024-01-05 DIAGNOSIS — Z602 Problems related to living alone: Secondary | ICD-10-CM | POA: Diagnosis present

## 2024-01-05 DIAGNOSIS — I82412 Acute embolism and thrombosis of left femoral vein: Secondary | ICD-10-CM | POA: Diagnosis not present

## 2024-01-05 DIAGNOSIS — R112 Nausea with vomiting, unspecified: Secondary | ICD-10-CM | POA: Diagnosis not present

## 2024-01-05 HISTORY — DX: Acute embolism and thrombosis of unspecified deep veins of left lower extremity: I82.402

## 2024-01-05 LAB — LACTIC ACID, PLASMA: Lactic Acid, Venous: 1.4 mmol/L (ref 0.5–1.9)

## 2024-01-05 LAB — COMPREHENSIVE METABOLIC PANEL WITH GFR
ALT: 14 U/L (ref 0–44)
AST: 15 U/L (ref 15–41)
Albumin: 3.7 g/dL (ref 3.5–5.0)
Alkaline Phosphatase: 58 U/L (ref 38–126)
Anion gap: 13 (ref 5–15)
BUN: 12 mg/dL (ref 8–23)
CO2: 21 mmol/L — ABNORMAL LOW (ref 22–32)
Calcium: 9.3 mg/dL (ref 8.9–10.3)
Chloride: 98 mmol/L (ref 98–111)
Creatinine, Ser: 0.92 mg/dL (ref 0.61–1.24)
GFR, Estimated: >60 mL/min (ref >60)
Glucose, Bld: 137 mg/dL — ABNORMAL HIGH (ref 70–99)
Potassium: 3.6 mmol/L (ref 3.5–5.1)
Sodium: 132 mmol/L — ABNORMAL LOW (ref 135–145)
Total Bilirubin: 1 mg/dL (ref 0.0–1.2)
Total Protein: 7 g/dL (ref 6.5–8.1)

## 2024-01-05 LAB — CBC WITH DIFFERENTIAL/PLATELET
Abs Immature Granulocytes: 0.08 10*3/uL — ABNORMAL HIGH (ref 0.00–0.07)
Basophils Absolute: 0 10*3/uL (ref 0.0–0.1)
Basophils Relative: 0 %
Eosinophils Absolute: 0 10*3/uL (ref 0.0–0.5)
Eosinophils Relative: 0 %
HCT: 43.2 % (ref 39.0–52.0)
Hemoglobin: 15.3 g/dL (ref 13.0–17.0)
Immature Granulocytes: 1 %
Lymphocytes Relative: 5 %
Lymphs Abs: 0.6 10*3/uL — ABNORMAL LOW (ref 0.7–4.0)
MCH: 32.1 pg (ref 26.0–34.0)
MCHC: 35.4 g/dL (ref 30.0–36.0)
MCV: 90.8 fL (ref 80.0–100.0)
Monocytes Absolute: 0.7 10*3/uL (ref 0.1–1.0)
Monocytes Relative: 7 %
Neutro Abs: 9.1 10*3/uL — ABNORMAL HIGH (ref 1.7–7.7)
Neutrophils Relative %: 87 %
Platelets: 222 10*3/uL (ref 150–400)
RBC: 4.76 MIL/uL (ref 4.22–5.81)
RDW: 13.2 % (ref 11.5–15.5)
WBC: 10.5 10*3/uL (ref 4.0–10.5)
nRBC: 0 % (ref 0.0–0.2)

## 2024-01-05 LAB — RESP PANEL BY RT-PCR (RSV, FLU A&B, COVID)  RVPGX2
Influenza A by PCR: NEGATIVE
Influenza B by PCR: NEGATIVE
Resp Syncytial Virus by PCR: NEGATIVE
SARS Coronavirus 2 by RT PCR: NEGATIVE

## 2024-01-05 LAB — PROTIME-INR
INR: 1.1 (ref 0.8–1.2)
Prothrombin Time: 14.3 s (ref 11.4–15.2)

## 2024-01-05 LAB — APTT: aPTT: 29 s (ref 24–36)

## 2024-01-05 MED ORDER — HEPARIN (PORCINE) 25000 UT/250ML-% IV SOLN
1550.0000 [IU]/h | INTRAVENOUS | Status: AC
  Administered 2024-01-05: 1400 [IU]/h via INTRAVENOUS
  Administered 2024-01-06: 1550 [IU]/h via INTRAVENOUS
  Filled 2024-01-05 (×2): qty 250

## 2024-01-05 MED ORDER — ONDANSETRON HCL 4 MG/2ML IJ SOLN
4.0000 mg | Freq: Four times a day (QID) | INTRAMUSCULAR | Status: DC | PRN
  Administered 2024-01-07 – 2024-01-11 (×2): 4 mg via INTRAVENOUS
  Filled 2024-01-05 (×2): qty 2

## 2024-01-05 MED ORDER — VANCOMYCIN HCL 1750 MG/350ML IV SOLN
1750.0000 mg | INTRAVENOUS | Status: DC
  Administered 2024-01-06 – 2024-01-10 (×5): 1750 mg via INTRAVENOUS
  Filled 2024-01-05 (×5): qty 350

## 2024-01-05 MED ORDER — GUAIFENESIN 100 MG/5ML PO LIQD
5.0000 mL | ORAL | Status: DC | PRN
  Administered 2024-01-05: 5 mL via ORAL
  Filled 2024-01-05: qty 10

## 2024-01-05 MED ORDER — HEPARIN BOLUS VIA INFUSION
2500.0000 [IU] | Freq: Once | INTRAVENOUS | Status: AC
  Administered 2024-01-05: 2500 [IU] via INTRAVENOUS
  Filled 2024-01-05: qty 2500

## 2024-01-05 MED ORDER — VANCOMYCIN HCL IN DEXTROSE 1-5 GM/200ML-% IV SOLN
1000.0000 mg | Freq: Once | INTRAVENOUS | Status: AC
  Administered 2024-01-05: 1000 mg via INTRAVENOUS

## 2024-01-05 MED ORDER — OXYCODONE HCL 5 MG PO TABS: 5.0000 mg | ORAL_TABLET | ORAL | Status: DC | PRN

## 2024-01-05 MED ORDER — ONDANSETRON HCL 4 MG PO TABS: 4.0000 mg | ORAL_TABLET | Freq: Four times a day (QID) | ORAL | Status: DC | PRN

## 2024-01-05 MED ORDER — HYDROMORPHONE HCL 1 MG/ML IJ SOLN
0.5000 mg | INTRAMUSCULAR | Status: DC | PRN
  Administered 2024-01-06 – 2024-01-13 (×12): 0.5 mg via INTRAVENOUS
  Filled 2024-01-05 (×12): qty 0.5

## 2024-01-05 MED ORDER — VANCOMYCIN HCL 500 MG IV SOLR
500.0000 mg | Freq: Once | INTRAVENOUS | Status: AC
  Administered 2024-01-05: 500 mg via INTRAVENOUS

## 2024-01-05 MED ORDER — OXYCODONE HCL 5 MG PO TABS
5.0000 mg | ORAL_TABLET | ORAL | Status: DC | PRN
  Administered 2024-01-05 – 2024-01-14 (×19): 5 mg via ORAL
  Filled 2024-01-05 (×19): qty 1

## 2024-01-05 MED ORDER — ACETAMINOPHEN 325 MG PO TABS
650.0000 mg | ORAL_TABLET | Freq: Once | ORAL | Status: AC
  Administered 2024-01-05: 650 mg via ORAL
  Filled 2024-01-05: qty 2

## 2024-01-05 MED ORDER — VANCOMYCIN HCL IN DEXTROSE 1-5 GM/200ML-% IV SOLN
1000.0000 mg | Freq: Once | INTRAVENOUS | Status: DC
Start: 2024-01-05 — End: 2024-01-05

## 2024-01-05 MED ORDER — POLYETHYLENE GLYCOL 3350 17 G PO PACK
17.0000 g | PACK | Freq: Every day | ORAL | Status: DC | PRN
  Administered 2024-01-10 – 2024-01-13 (×3): 17 g via ORAL
  Filled 2024-01-05 (×3): qty 1

## 2024-01-05 MED ORDER — SODIUM CHLORIDE 0.9% FLUSH
3.0000 mL | Freq: Two times a day (BID) | INTRAVENOUS | Status: DC
  Administered 2024-01-05 – 2024-01-14 (×14): 3 mL via INTRAVENOUS

## 2024-01-05 MED ORDER — ACETAMINOPHEN 650 MG RE SUPP: 650.0000 mg | Freq: Four times a day (QID) | RECTAL | Status: DC | PRN

## 2024-01-05 MED ORDER — ACETAMINOPHEN 325 MG PO TABS
650.0000 mg | ORAL_TABLET | Freq: Four times a day (QID) | ORAL | Status: DC | PRN
  Administered 2024-01-10 (×2): 650 mg via ORAL
  Filled 2024-01-05 (×2): qty 2

## 2024-01-05 MED ORDER — MELATONIN 3 MG PO TABS
3.0000 mg | ORAL_TABLET | Freq: Every evening | ORAL | Status: DC | PRN
  Administered 2024-01-05 – 2024-01-13 (×8): 3 mg via ORAL
  Filled 2024-01-05 (×8): qty 1

## 2024-01-05 MED ORDER — FENTANYL CITRATE PF 50 MCG/ML IJ SOSY
50.0000 ug | PREFILLED_SYRINGE | Freq: Once | INTRAMUSCULAR | Status: AC
  Administered 2024-01-05: 50 ug via INTRAVENOUS
  Filled 2024-01-05: qty 1

## 2024-01-05 MED ORDER — VANCOMYCIN HCL IN DEXTROSE 1-5 GM/200ML-% IV SOLN
1000.0000 mg | Freq: Once | INTRAVENOUS | Status: DC
Start: 2024-01-05 — End: 2024-01-05
  Filled 2024-01-05: qty 200

## 2024-01-05 MED ORDER — CLONAZEPAM 1 MG PO TABS
1.0000 mg | ORAL_TABLET | Freq: Every evening | ORAL | Status: DC | PRN
Start: 2024-01-05 — End: 2024-01-14
  Administered 2024-01-05: 1 mg via ORAL
  Filled 2024-01-05: qty 1

## 2024-01-05 NOTE — ED Notes (Addendum)
 Report attempted to the Floor RN... [phone]

## 2024-01-05 NOTE — Progress Notes (Signed)
 Pharmacy Antibiotic Note  [name] is a 83 y.o. male admitted on 01/05/2024 with LLE cellulitis.  Pharmacy has been consulted for Vancomycin dosing.  Plan: Vancomycin 1750mg  IV q24h to target AUC 400-550.  Estimated AUC on this regimen = 480. Monitor renal function and cx data   Height: 6' (182.9 cm) Weight: 83.4 kg (183 lb 14.4 oz) IBW/kg (Calculated) : 77.6  Temp (24hrs), Avg:99.6 F (37.6 C), Min:97.6 F (36.4 C), Max:101.1 F (38.4 C)  Recent Labs  Lab 01/05/24 1704  WBC 10.5  CREATININE 0.92  LATICACIDVEN 1.4    Estimated Creatinine Clearance: 67.9 mL/min (by C-G formula based on SCr of 0.92 mg/dL).    Allergies  Allergen Reactions   Iohexol      Code: SOB, Desc: ANAPHYLATIC SHOCK W/ IV CONTRAST/MMS     Antimicrobials this admission: 3/30 Vancomycin >>    Dose adjustments this admission:  Microbiology results: 3/30 BCx:  3/30 Resp PCR: negative  Thank you for allowing pharmacy to be a part of this patient's care.  Junita Push PharmD 01/05/2024 11:19 PM

## 2024-01-05 NOTE — ED Notes (Signed)
 1st set of cultures, lactic & basic specimens sent to lab

## 2024-01-05 NOTE — ED Notes (Signed)
 Report given to the floor RN.

## 2024-01-05 NOTE — ED Triage Notes (Signed)
 Pt c/o wound to L leg, first noticed approx 1wk ago. On eliquis, reports delayed healing to many wounds. Denies known fevers, additional known symptoms

## 2024-01-05 NOTE — Plan of Care (Addendum)
 Plan of Care Note for accepted transfer  Patient: [name]              WUJ:[id number]  DOA: 01/05/2024     Facility requesting transfer: [facility] emergency department Requesting Provider: Dr. Fredderick Phenix  Reason for transfer: Left lower extremity cellulitis and recurrent DVT  ED triage note:Pt c/o wound to L leg, first noticed approx 1wk ago. On eliquis, reports delayed healing to many wounds. 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 eg.  He states he has had a prior DVT about a year ago in the left leg.  He states he has some intermittent swelling in the left leg but about 2 weeks ago he bumped his left leg on his bed frame.  He had a small wound to the leg.  Its gotten worse and has started to have some increased redness over the last 3 to 4 days.  It starting to have some drainage.  He has some increased pain and swelling in the leg.  He he has not had any known fevers at home but was noted to be febrile to 101 here in the ED.  He has had a little bit of cough and a little bit of rhinorrhea.  No vomiting or diarrhea.  No abdominal pain.   At presentation to ED patient found febrile otherwise hemodynamically stable. Normal lactic acid level. CMP showing low sodium 132, low bicarb 21 blood glucose 137 otherwise unremarkable. CBC unremarkable.  No evidence of leukocytosis Normal pro time INR. Blood cultures are in process.  X-ray of the left tibia-fibula no abnormality. Chest x-ray no active disease process. DVT ultrasound of the left lower extremity showing partial occlusion of the left lower EXTR DVT involving left common femoral vein,  femoral vein and popliteal vein.  It is unclear if patient has a new DVT of the left lower extremity versus extension of the previous DVT which has not been resolved yet.  Patient reported he has been compliant with Eliquis except missed 1 dose yesterday.  Questionable Eliquis failure versus adherence?  Discussed with Dr. Fredderick Phenix planning to start IV heparin drip considering it as an Eliquis failure.  Need to reach out to hematology to discuss further anticoagulation management.  For the left lower extremity cellulitis patient has been treated with IV vancomycin.  Hospitalist has been consulted for further evaluation and management of left lower extremity cellulitis and recurrent DVT    Plan of care: The patient is accepted for admission for inpatient status to Telemetry unit, at  [facility] long hospital.  [facility] will assume care on arrival to accepting facility. Until arrival, care as per EDP. However, [facility] available 24/7 for questions and assistance.   Check [email or web address] for on-call coverage.   Nursing staff, please call [facility] Admits & Consults System-Wide number under Amion on patient's arrival so appropriate admitting provider can evaluate the pt.    Author: Tereasa Coop, MD  01/05/2024  [facility]

## 2024-01-05 NOTE — Progress Notes (Signed)
 PHARMACY - ANTICOAGULATION CONSULT NOTE  Pharmacy Consult for Heparin Indication: DVT  Allergies  Allergen Reactions   Iohexol      Code: SOB, Desc: ANAPHYLATIC SHOCK W/ IV CONTRAST/MMS     Patient Measurements:   Heparin dose weight: TBW  Vital Signs: Temp: 97.6 F (36.4 C) (03/30 2133) Temp Source: Oral (03/30 1902) BP: 127/64 (03/30 2133) Pulse Rate: 94 (03/30 2133)  Labs: Recent Labs    01/05/24 1704  HGB 15.3  HCT 43.2  PLT 222  APTT 29  LABPROT 14.3  INR 1.1  CREATININE 0.92    CrCl cannot be calculated (Unknown ideal weight.).   Medical History: Past Medical History:  Diagnosis Date   CAD (coronary artery disease)    Hepatitis    Hep C   Mental disorder    PTSD   PTSD (post-traumatic stress disorder)    Stroke (HCC)     Medications:  Infusions:   heparin      Assessment: 83 yo M with LLE cellulitis and recurrent DVT.  On Eliquis PTA- reports compliance with medication and last took dose today ~ 10a.  Dopplers reveal + partially occlusive left lower extremity DVT involving the left common femoral vein, femoral vein and popliteal vein. Baseline labs: CBC WNL, Scr WNL, aPTT/INR WNL. Pharmacy consulted to dose IV heparin.  Hematology consulted to weigh in re: Eliquis treatment failure vs. compliance issue. Baseline heparin level not checked, but will assume it is elevated since pt is on Eliquis (drug-lab interaction).    Goal of Therapy:  Heparin level 0.3-0.7 units/ml aPTT 66-102 seconds Monitor platelets by anticoagulation protocol: Yes   Plan:  Heparin 2500 units IV bolus (>12h since last Eliquis dose and concern for new vs untreated DVT so will give small bolus) Start IV heparin infusion at 1400 units/hr Check aPTT & heparin level in 8 hrs -->will use aPTT to monitor heparin until Eliquis is cleared and aPTT correlates with heparin level Daily heparin level, CBC while on heparin  Junita Push PharmD 01/05/2024,9:43 PM

## 2024-01-05 NOTE — Progress Notes (Signed)
 Update, patient requested to be admitted at [facility].

## 2024-01-05 NOTE — ED Provider Notes (Signed)
 [place] EMERGENCY DEPARTMENT AT [facility] Provider Note   CSN: [id number] Arrival date & time: 01/05/24  1641     History  Chief Complaint  Patient presents with   Leg Pain    L   Wound Infection    L leg    [name] is a 83 y.o. male.  Patient is a 83 year old male who presents with pain and swelling to his left leg.  He states he has had a prior DVT about a year ago in the left leg.  He states he has some intermittent swelling in the left leg but about 2 weeks ago he bumped his left leg on his bed frame.  He had a small wound to the leg.  Its gotten worse and has started to have some increased redness over the last 3 to 4 days.  It starting to have some drainage.  He has some increased pain and swelling in the leg.  He he has not had any known fevers at home but was noted to be febrile to 101 here in the ED.  He has had a little bit of cough and a little bit of rhinorrhea.  No vomiting or diarrhea.  No abdominal pain.       Home Medications Prior to Admission medications   Medication Sig Start Date End Date Taking? Authorizing Provider  ACIDOPHILUS LACTOBACILLUS PO Take 2 tablets by mouth daily.    [provider]  apixaban (ELIQUIS) 5 MG TABS tablet Take 5 mg by mouth 2 (two) times daily.    [provider]  aspirin 81 MG tablet Take 81 mg by mouth daily.    [provider]  Cholecalciferol 25 MCG (1000 UT) TBDP Take 1,000 Units by mouth daily.    [provider]  clonazePAM (KLONOPIN) 0.5 MG tablet Take 1.5 mg by mouth at bedtime.    [provider]  COENZYME Q-10 PO Take 1 capsule by mouth daily.      [provider]  docusate sodium (COLACE) 100 MG capsule Take 200 mg by mouth daily.    [provider]  finasteride (PROSCAR) 5 MG tablet Take 5 mg by mouth daily.    [provider]  hydrocortisone 1 % lotion Apply 1 Application topically 3 (three) times daily as needed for itching  (skin rash).    [provider]  latanoprost (XALATAN) 0.005 % ophthalmic solution Place 1 drop into both eyes at bedtime.    [provider]  Multiple Vitamin (MULTIVITAMIN) tablet Take 1 tablet by mouth daily.    [provider]  Omega-3 Fatty Acids (FISH OIL) 1000 MG CAPS Take 1,000 mg by mouth daily.    [provider]  omeprazole (PRILOSEC OTC) 20 MG tablet Take 20 mg by mouth daily.    [provider]  oxyCODONE (ROXICODONE) 5 MG immediate release tablet Take 1 tablet (5 mg total) by mouth every 6 (six) hours as needed for severe pain. 03/12/23   Lonell Grandchild, MD  sildenafil (VIAGRA) 100 MG tablet Take 100 mg by mouth as needed for erectile dysfunction.    [provider]  tamsulosin (FLOMAX) 0.4 MG CAPS capsule Take 0.4 mg by mouth daily after supper.    [provider]      Allergies    Iohexol    Review of Systems   Review of Systems  Constitutional:  Positive for fatigue and fever. Negative for chills and diaphoresis.  HENT:  Negative  for congestion, rhinorrhea and sneezing.   Eyes: Negative.   Respiratory:  Positive for cough. Negative for chest tightness and shortness of breath.   Cardiovascular:  Positive for leg swelling. Negative for chest pain.  Gastrointestinal:  Negative for abdominal pain, blood in stool, diarrhea, nausea and vomiting.  Genitourinary:  Negative for difficulty urinating, flank pain, frequency and hematuria.  Musculoskeletal:  Positive for myalgias. Negative for arthralgias and back pain.  Skin:  Negative for rash.  Neurological:  Negative for dizziness, speech difficulty, weakness, numbness and headaches.    Physical Exam Updated Vital Signs BP 129/73   Pulse 92   Temp 99.9 F (37.7 C) (Oral)   Resp 19   SpO2 100%  Physical Exam Constitutional:      Appearance: He is well-developed.  HENT:     Head: Normocephalic and atraumatic.  Eyes:     Pupils: Pupils are equal, round,  and reactive to light.  Cardiovascular:     Rate and Rhythm: Normal rate and regular rhythm.     Heart sounds: Normal heart sounds.  Pulmonary:     Effort: Pulmonary effort is normal. No respiratory distress.     Breath sounds: Normal breath sounds. No wheezing or rales.  Chest:     Chest wall: No tenderness.  Abdominal:     General: Bowel sounds are normal.     Palpations: Abdomen is soft.     Tenderness: There is no abdominal tenderness. There is no guarding or rebound.  Musculoskeletal:        General: Normal range of motion.     Cervical back: Normal range of motion and neck supple.     Comments: Marked swelling to the left lower leg up to the knee.  There is erythema with some purpuric lesions to the anterior part of the lower leg.  Pedal pulses are obtained by Doppler.  He has a draining wound in the mid lower leg.  No induration or fluctuance is noted.  Lymphadenopathy:     Cervical: No cervical adenopathy.  Skin:    General: Skin is warm and dry.     Findings: No rash.  Neurological:     Mental Status: He is alert and oriented to person, place, and time.     ED Results / Procedures / Treatments   Labs (all labs ordered are listed, but only abnormal results are displayed) Labs Reviewed  COMPREHENSIVE METABOLIC PANEL WITH GFR - Abnormal; Notable for the following components:      Result Value   Sodium 132 (*)    CO2 21 (*)    Glucose, Bld 137 (*)    All other components within normal limits  CBC WITH DIFFERENTIAL/PLATELET - Abnormal; Notable for the following components:   Neutro Abs 9.1 (*)    Lymphs Abs 0.6 (*)    Abs Immature Granulocytes 0.08 (*)    All other components within normal limits  RESP PANEL BY RT-PCR (RSV, FLU A&B, COVID)  [id number]  CULTURE, BLOOD (ROUTINE X 2)  CULTURE, BLOOD (ROUTINE X 2)  LACTIC ACID, PLASMA  PROTIME-INR  APTT    EKG EKG Interpretation Date/Time:  Sunday January 05 2024 16:57:31 EDT Ventricular Rate:  109 PR  Interval:  172 QRS Duration:  98 QT Interval:  319 QTC Calculation: 430 R Axis:   49  Text Interpretation: Sinus tachycardia since last tracing no significant change Confirmed by Rolan Bucco [phone]) on 01/05/2024 7:17:36 PM  Radiology DG Chest Port 1 View Result Date: 01/05/2024  CLINICAL DATA:  Left leg wound. EXAM: PORTABLE CHEST 1 VIEW COMPARISON:  September 13, 2022. FINDINGS: The heart size and mediastinal contours are within normal limits. Minimal bibasilar subsegmental atelectasis or scarring is noted. The visualized skeletal structures are unremarkable. IMPRESSION: No active disease. Electronically Signed   By: Lupita Raider M.D.   On: 01/05/2024 18:30   DG Tibia/Fibula Left Result Date: 01/05/2024 CLINICAL DATA:  Left lower extremity wound for 1 week. EXAM: LEFT TIBIA AND FIBULA - 2 VIEW COMPARISON:  None Available. FINDINGS: Status post left total knee arthroplasty. No fracture or dislocation is noted. Vascular calcifications are noted. No lytic destruction is noted. IMPRESSION: No acute abnormality seen. Electronically Signed   By: Lupita Raider M.D.   On: 01/05/2024 18:29   US Venous Img Lower Unilateral Left (DVT) Result Date: 01/05/2024 CLINICAL DATA:  Nonhealing wound left calf.  Swelling. EXAM: LEFT LOWER EXTREMITY VENOUS DOPPLER ULTRASOUND TECHNIQUE: Gray-scale sonography with graded compression, as well as color Doppler and duplex ultrasound were performed to evaluate the lower extremity deep venous systems from the level of the common femoral vein and including the common femoral, femoral, profunda femoral, popliteal and calf veins including the posterior tibial, peroneal and gastrocnemius veins when visible. The superficial great saphenous vein was also interrogated. Spectral Doppler was utilized to evaluate flow at rest and with distal augmentation maneuvers in the common femoral, femoral and popliteal veins. COMPARISON:  None Available. FINDINGS: Contralateral Common Femoral  Vein: No evidence of thrombus. Normal compressibility. Common Femoral Vein: Nonocclusive clot noted with partial compressibility. Saphenofemoral Junction: No evidence of thrombus. Normal compressibility and flow on color Doppler imaging. Profunda Femoral Vein: No evidence of thrombus. Normal compressibility and flow on color Doppler imaging. Femoral Vein: Nonocclusive clot with partial compressibility. Popliteal Vein: Nonocclusive clot with partial compressibility. Calf Veins: Limited visualization.  No evidence of thrombus. Superficial Great Saphenous Vein: No evidence of thrombus. Normal compressibility. Venous Reflux:  Not assessed Other Findings:  None. IMPRESSION: Partially occlusive left lower extremity DVT involving the left common femoral vein, femoral vein and popliteal vein. Electronically Signed   By: Charlett Nose M.D.   On: 01/05/2024 18:17    Procedures Procedures    Medications Ordered in ED Medications  fentaNYL (SUBLIMAZE) injection 50 mcg (50 mcg Intravenous Given 01/05/24 1731)  vancomycin (VANCOCIN) IVPB 1000 mg/200 mL premix (0 mg Intravenous Stopped 01/05/24 1850)    Followed by  vancomycin (VANCOCIN) 500 mg in sodium chloride 0.9 % 100 mL IVPB (0 mg Intravenous Stopped 01/05/24 1954)  acetaminophen (TYLENOL) tablet 650 mg (650 mg Oral Given 01/05/24 1845)    ED Course/ Medical Decision Making/ A&P                                 Medical Decision Making Amount and/or Complexity of Data Reviewed Labs: ordered. Radiology: ordered.  Risk OTC drugs. Prescription drug management. Decision regarding hospitalization.   Patient is an 83 year old male who presents with a wound to his left leg.  He has a large area of what appears to be cellulitis.  He is febrile with a temperature of 101.  He was tachycardic on arrival but that has resolved with treatment of his fever.  His white count is normal.  His lactate is normal.  No other markers for sepsis.  X-rays were performed of  the leg which were interpreted by me and confirmed by the radiologist to show  no obvious bony involvement or gas in the tissues.  Ultrasound of the leg was performed which shows evidence of a DVT.  It is unclear whether this is old or new.  Patient is a poor historian and I cannot find any documented prior ultrasounds to compare in his chart as the patient goes to the [facility].  He does sound like he is mostly compliant with his Eliquis although he says he missed a dose yesterday.  He says he has not missed more than 3 doses over his course of treatment for his DVT although he does not really know how long he has had the DVT.  1 time he told me it was a year and a half ago and another time he told me he was about 6 months ago so I am not really if this constitutes a failure of the Eliquis or not.  Patient was given IV antibiotics for his cellulitis.  Discussed admission with Dr. Janalyn Shy with the hospitalist service.  We will go ahead and start him on heparin and they will do a hematology consult tomorrow to get further guidance on management of the DVT.  He will be admitted to the hospital.  Final Clinical Impression(s) / ED Diagnoses Final diagnoses:  Cellulitis of left lower extremity  Deep vein thrombosis (DVT) of proximal vein of left lower extremity, unspecified chronicity [facility])    Rx / DC Orders ED Discharge Orders     None         Rolan Bucco, MD 01/05/24 2004

## 2024-01-05 NOTE — H&P (Signed)
 History and Physical    [name]:[id number] DOB: Apr 21, 1941 DOA: 01/05/2024  PCP: Darrow Bussing, MD   Patient coming from: Home   Chief Complaint: Left leg, redness, swelling, and pain   HPI: [name] is a 83 y.o. male with medical history significant for hepatitis C, cirrhosis, TIA, anxiety, left leg DVT on Eliquis who presents with worsening left, swelling, and redness.  Patient reports history of left lower extremity DVT in December 2023 in the setting of major trauma.  He has been on Eliquis and reports missing only 3 doses since he began taking it.  He reports developing a small wound on the anterior aspect of his lower left leg 2 weeks ago when he bumped into a piece of furniture.  Over the past several days, he has had worsening redness, pain, and swelling surrounding the wound, and now is having some purulent drainage as well.  Pain is severe and he is unable to bear weight on the leg.  He denies chest pain, shortness of breath, or hemoptysis.  [facility] ED Course: Upon arrival to the ED, patient is found to be febrile to 38.4 C with mild tachycardia and stable blood pressure.  Labs are most notable for normal WBC, normal lactic acid, and normal creatinine.  Lower extremity venous Doppler demonstrates partially occlusive LLE DVT.  Blood cultures were collected in the ED, patient was treated with acetaminophen, fentanyl, vancomycin, and IV heparin, and he was transferred to [facility] for admission.  Review of Systems:  All other systems reviewed and apart from HPI, are negative.  Past Medical History:  Diagnosis Date   CAD (coronary artery disease)    Deep vein thrombosis (DVT) of left lower extremity (HCC) 01/05/2024   Hepatitis    Hep C   Mental disorder    PTSD   PTSD (post-traumatic stress disorder)    Stroke [facility])     Past Surgical History:  Procedure Laterality Date   APPENDECTOMY     ESOPHAGOGASTRODUODENOSCOPY N/A 03/27/2013    Procedure: ESOPHAGOGASTRODUODENOSCOPY (EGD);  Surgeon: Meryl Dare, MD;  Location: [facility];  Service: Endoscopy;  Laterality: N/A;   FOREIGN BODY REMOVAL N/A 03/27/2013   Procedure: FOREIGN BODY REMOVAL;  Surgeon: Meryl Dare, MD;  Location: [facility] ENDOSCOPY;  Service: Endoscopy;  Laterality: N/A;   JOINT REPLACEMENT     Bilateral   KNEE SURGERY     TONSILLECTOMY      Social History:   reports that he quit smoking about 28 years ago. He has never used smokeless tobacco. He reports that he does not drink alcohol and does not use drugs.  Allergies  Allergen Reactions   Iohexol      Code: SOB, Desc: ANAPHYLATIC SHOCK W/ IV CONTRAST/MMS     Family History  Problem Relation Age of Onset   Heart disease Father    Deep vein thrombosis Brother    Diabetes Brother      Prior to Admission medications   Medication Sig Start Date End Date Taking? Authorizing Provider  ACIDOPHILUS LACTOBACILLUS PO Take 2 tablets by mouth daily.    [provider]  apixaban (ELIQUIS) 5 MG TABS tablet Take 5 mg by mouth 2 (two) times daily.   Yes [provider]  aspirin 81 MG tablet Take 81 mg by mouth daily.    [provider]  Cholecalciferol 25 MCG (1000 UT) TBDP Take 1,000 Units by mouth daily.    [provider]  clonazePAM (  KLONOPIN) 0.5 MG tablet Take 1.5 mg by mouth at bedtime.    [provider]  COENZYME Q-10 PO Take 1 capsule by mouth daily.      [provider]  docusate sodium (COLACE) 100 MG capsule Take 200 mg by mouth daily.    [provider]  finasteride (PROSCAR) 5 MG tablet Take 5 mg by mouth daily.    [provider]  hydrocortisone 1 % lotion Apply 1 Application topically 3 (three) times daily as needed for itching (skin rash).    [provider]  latanoprost (XALATAN) 0.005 % ophthalmic solution Place 1 drop into both eyes at bedtime.    [provider]  Multiple Vitamin (MULTIVITAMIN)  tablet Take 1 tablet by mouth daily.    [provider]  Omega-3 Fatty Acids (FISH OIL) 1000 MG CAPS Take 1,000 mg by mouth daily.    [provider]  omeprazole (PRILOSEC OTC) 20 MG tablet Take 20 mg by mouth daily.    [provider]  oxyCODONE (ROXICODONE) 5 MG immediate release tablet Take 1 tablet (5 mg total) by mouth every 6 (six) hours as needed for severe pain. 03/12/23   Lonell Grandchild, MD  sildenafil (VIAGRA) 100 MG tablet Take 100 mg by mouth as needed for erectile dysfunction.    [provider]  tamsulosin (FLOMAX) 0.4 MG CAPS capsule Take 0.4 mg by mouth daily after supper.    [provider]    Physical Exam: Vitals:   01/05/24 1930 01/05/24 2000 01/05/24 2030 01/05/24 2133  BP: 129/73 122/64 119/72 127/64  Pulse: 92 97 99 94  Resp: 19 20 (!) 24 18  Temp:    97.6 F (36.4 C)  TempSrc:    Oral  SpO2: 100% 98% 97% 97%  Weight:    83.4 kg  Height:    6' (1.829 m)    Constitutional: NAD, no pallor or diaphoresis   Eyes: PERTLA, lids and conjunctivae normal ENMT: Mucous membranes are moist. Posterior pharynx clear of any exudate or lesions.   Neck: supple, no masses  Respiratory: no wheezing, no crackles. No accessory muscle use.  Cardiovascular: S1 & S2 heard, regular rate and rhythm. No JVD. Abdomen: No distension, no tenderness, soft. Bowel sounds active.  Musculoskeletal: no clubbing / cyanosis. No joint deformity upper and lower extremities.   Skin: Erythema, heat, edema, and tenderness involving lower left leg. Skin is otherwise warm, dry, well-perfused. Neurologic: CN 2-12 grossly intact. Moving all extremities. Alert and oriented.  Psychiatric: Calm. Cooperative.    Labs and Imaging on Admission: I have personally reviewed following labs and imaging studies  CBC: Recent Labs  Lab 01/05/24 1704  WBC 10.5  NEUTROABS 9.1*  HGB 15.3  HCT 43.2  MCV 90.8  PLT 222   Basic Metabolic Panel: Recent Labs  Lab  01/05/24 1704  NA 132*  K 3.6  CL 98  CO2 21*  GLUCOSE 137*  BUN 12  CREATININE 0.92  CALCIUM 9.3   GFR: Estimated Creatinine Clearance: 67.9 mL/min (by C-G formula based on SCr of 0.92 mg/dL). Liver Function Tests: Recent Labs  Lab 01/05/24 1704  AST 15  ALT 14  ALKPHOS 58  BILITOT 1.0  PROT 7.0  ALBUMIN 3.7   No results for input(s): "LIPASE", "AMYLASE" in the last 168 hours. No results for input(s): "AMMONIA" in the last 168 hours. Coagulation Profile: Recent Labs  Lab 01/05/24 1704  INR 1.1   Cardiac Enzymes: No results for input(s): "  CKTOTAL", "CKMB", "CKMBINDEX", "TROPONINI" in the last 168 hours. BNP (last 3 results) No results for input(s): "PROBNP" in the last 8760 hours. HbA1C: No results for input(s): "HGBA1C" in the last 72 hours. CBG: No results for input(s): "GLUCAP" in the last 168 hours. Lipid Profile: No results for input(s): "CHOL", "HDL", "LDLCALC", "TRIG", "CHOLHDL", "LDLDIRECT" in the last 72 hours. Thyroid Function Tests: No results for input(s): "TSH", "T4TOTAL", "FREET4", "T3FREE", "THYROIDAB" in the last 72 hours. Anemia Panel: No results for input(s): "VITAMINB12", "FOLATE", "FERRITIN", "TIBC", "IRON", "RETICCTPCT" in the last 72 hours. Urine analysis:    Component Value Date/Time   COLORURINE YELLOW 09/14/2022 1242   APPEARANCEUR CLEAR 09/14/2022 1242   LABSPEC 1.017 09/14/2022 1242   PHURINE 6.0 09/14/2022 1242   GLUCOSEU NEGATIVE 09/14/2022 1242   HGBUR NEGATIVE 09/14/2022 1242   BILIRUBINUR NEGATIVE 09/14/2022 1242   KETONESUR NEGATIVE 09/14/2022 1242   PROTEINUR NEGATIVE 09/14/2022 1242   UROBILINOGEN 0.2 07/04/2011 1425   NITRITE NEGATIVE 09/14/2022 1242   LEUKOCYTESUR NEGATIVE 09/14/2022 1242   Sepsis Labs: @LABRCNTIP (procalcitonin:4,lacticidven:4) ) Recent Results (from the past 240 hours)  Resp panel by RT-PCR (RSV, Flu A&B, Covid) Anterior Nasal Swab     Status: None   Collection Time: 01/05/24  5:24 PM    Specimen: Anterior Nasal Swab  Result Value Ref Range Status   SARS Coronavirus 2 by RT PCR NEGATIVE NEGATIVE Final    Comment: (NOTE) SARS-CoV-2 target nucleic acids are NOT DETECTED.  The SARS-CoV-2 RNA is generally detectable in upper respiratory specimens during the acute phase of infection. The lowest concentration of SARS-CoV-2 viral copies this assay can detect is 138 copies/mL. A negative result does not preclude SARS-Cov-2 infection and should not be used as the sole basis for treatment or other patient management decisions. A negative result may occur with  improper specimen collection/handling, submission of specimen other than nasopharyngeal swab, presence of viral mutation(s) within the areas targeted by this assay, and inadequate number of viral copies(<138 copies/mL). A negative result must be combined with clinical observations, patient history, and epidemiological information. The expected result is Negative.  Fact Sheet for Patients:  BloggerCourse.com  Fact Sheet for Healthcare Providers:  SeriousBroker.it  This test is no t yet approved or cleared by the [place] FDA and  has been authorized for detection and/or diagnosis of SARS-CoV-2 by FDA under an Emergency Use Authorization (EUA). This EUA will remain  in effect (meaning this test can be used) for the duration of the COVID-19 declaration under Section 564(b)(1) of the Act, 21 U.S.C.section 360bbb-3(b)(1), unless the authorization is terminated  or revoked sooner.       Influenza A by PCR NEGATIVE NEGATIVE Final   Influenza B by PCR NEGATIVE NEGATIVE Final    Comment: (NOTE) The Xpert Xpress SARS-CoV-2/FLU/RSV plus assay is intended as an aid in the diagnosis of influenza from Nasopharyngeal swab specimens and should not be used as a sole basis for treatment. Nasal washings and aspirates are unacceptable for Xpert Xpress  SARS-CoV-2/FLU/RSV testing.  Fact Sheet for Patients: BloggerCourse.com  Fact Sheet for Healthcare Providers: SeriousBroker.it  This test is not yet approved or cleared by the [place] FDA and has been authorized for detection and/or diagnosis of SARS-CoV-2 by FDA under an Emergency Use Authorization (EUA). This EUA will remain in effect (meaning this test can be used) for the duration of the COVID-19 declaration under Section 564(b)(1) of the Act, 21 U.S.C. section 360bbb-3(b)(1), unless the authorization is terminated or revoked.  Resp Syncytial Virus by PCR NEGATIVE NEGATIVE Final    Comment: (NOTE) Fact Sheet for Patients: BloggerCourse.com  Fact Sheet for Healthcare Providers: SeriousBroker.it  This test is not yet approved or cleared by the [place] FDA and has been authorized for detection and/or diagnosis of SARS-CoV-2 by FDA under an Emergency Use Authorization (EUA). This EUA will remain in effect (meaning this test can be used) for the duration of the COVID-19 declaration under Section 564(b)(1) of the Act, 21 U.S.C. section 360bbb-3(b)(1), unless the authorization is terminated or revoked.  Performed at [facility], [address], [place], [place] [address]      Radiological Exams on Admission: DG Chest Port 1 View Result Date: 01/05/2024 CLINICAL DATA:  Left leg wound. EXAM: PORTABLE CHEST 1 VIEW COMPARISON:  September 13, 2022. FINDINGS: The heart size and mediastinal contours are within normal limits. Minimal bibasilar subsegmental atelectasis or scarring is noted. The visualized skeletal structures are unremarkable. IMPRESSION: No active disease. Electronically Signed   By: Lupita Raider M.D.   On: 01/05/2024 18:30   DG Tibia/Fibula Left Result Date: 01/05/2024 CLINICAL DATA:  Left lower extremity wound for 1 week. EXAM: LEFT  TIBIA AND FIBULA - 2 VIEW COMPARISON:  None Available. FINDINGS: Status post left total knee arthroplasty. No fracture or dislocation is noted. Vascular calcifications are noted. No lytic destruction is noted. IMPRESSION: No acute abnormality seen. Electronically Signed   By: Lupita Raider M.D.   On: 01/05/2024 18:29   US Venous Img Lower Unilateral Left (DVT) Result Date: 01/05/2024 CLINICAL DATA:  Nonhealing wound left calf.  Swelling. EXAM: LEFT LOWER EXTREMITY VENOUS DOPPLER ULTRASOUND TECHNIQUE: Gray-scale sonography with graded compression, as well as color Doppler and duplex ultrasound were performed to evaluate the lower extremity deep venous systems from the level of the common femoral vein and including the common femoral, femoral, profunda femoral, popliteal and calf veins including the posterior tibial, peroneal and gastrocnemius veins when visible. The superficial great saphenous vein was also interrogated. Spectral Doppler was utilized to evaluate flow at rest and with distal augmentation maneuvers in the common femoral, femoral and popliteal veins. COMPARISON:  None Available. FINDINGS: Contralateral Common Femoral Vein: No evidence of thrombus. Normal compressibility. Common Femoral Vein: Nonocclusive clot noted with partial compressibility. Saphenofemoral Junction: No evidence of thrombus. Normal compressibility and flow on color Doppler imaging. Profunda Femoral Vein: No evidence of thrombus. Normal compressibility and flow on color Doppler imaging. Femoral Vein: Nonocclusive clot with partial compressibility. Popliteal Vein: Nonocclusive clot with partial compressibility. Calf Veins: Limited visualization.  No evidence of thrombus. Superficial Great Saphenous Vein: No evidence of thrombus. Normal compressibility. Venous Reflux:  Not assessed Other Findings:  None. IMPRESSION: Partially occlusive left lower extremity DVT involving the left common femoral vein, femoral vein and popliteal vein.  Electronically Signed   By: Charlett Nose M.D.   On: 01/05/2024 18:17    EKG: Independently reviewed. Sinus tachycardia, rate 109.   Assessment/Plan   1. LLE cellulitis  - Continue vancomycin, follow cultures and clinical course, obtain imaging if worsens or fails to improve as expected    2. LLE DVT  - He reports missing only 3 doses of Eliquis since he started it over a year ago  - No signs or symptoms of PE  - Continue IV heparin for now    3. Cirrhosis  - Appears compensated    4. Anxiety  - Klonopin as-needed    DVT prophylaxis: IV heparin  Code Status:  Full  Level of Care: Level of care: Telemetry Family Communication: None present  Disposition Plan:  Patient is from: Home  Anticipated d/c is to: TBD Anticipated d/c date is: 01/08/24  Patient currently: Pending pain-control, improvement in cellulitis, transition to oral anticoagulant Consults called: None  Admission status: Inpatient     Briscoe Deutscher, MD [facility]  01/05/2024, 10:49 PM

## 2024-01-05 NOTE — ED Notes (Signed)
Report given to [facility] 

## 2024-01-06 DIAGNOSIS — Z7901 Long term (current) use of anticoagulants: Secondary | ICD-10-CM | POA: Diagnosis not present

## 2024-01-06 DIAGNOSIS — L03116 Cellulitis of left lower limb: Secondary | ICD-10-CM | POA: Diagnosis not present

## 2024-01-06 DIAGNOSIS — I824Y2 Acute embolism and thrombosis of unspecified deep veins of left proximal lower extremity: Secondary | ICD-10-CM | POA: Diagnosis not present

## 2024-01-06 DIAGNOSIS — K7460 Unspecified cirrhosis of liver: Secondary | ICD-10-CM | POA: Diagnosis not present

## 2024-01-06 LAB — BASIC METABOLIC PANEL WITH GFR
Anion gap: 9 (ref 5–15)
BUN: 11 mg/dL (ref 8–23)
CO2: 21 mmol/L — ABNORMAL LOW (ref 22–32)
Calcium: 8.5 mg/dL — ABNORMAL LOW (ref 8.9–10.3)
Chloride: 101 mmol/L (ref 98–111)
Creatinine, Ser: 0.76 mg/dL (ref 0.61–1.24)
GFR, Estimated: >60 mL/min (ref >60)
Glucose, Bld: 138 mg/dL — ABNORMAL HIGH (ref 70–99)
Potassium: 3.2 mmol/L — ABNORMAL LOW (ref 3.5–5.1)
Sodium: 131 mmol/L — ABNORMAL LOW (ref 135–145)

## 2024-01-06 LAB — APTT
aPTT: 52 s — ABNORMAL HIGH (ref 24–36)
aPTT: >200 s (ref 24–36)

## 2024-01-06 LAB — CBC
HCT: 40.6 % (ref 39.0–52.0)
Hemoglobin: 13.4 g/dL (ref 13.0–17.0)
MCH: 31.4 pg (ref 26.0–34.0)
MCHC: 33 g/dL (ref 30.0–36.0)
MCV: 95.1 fL (ref 80.0–100.0)
Platelets: 189 10*3/uL (ref 150–400)
RBC: 4.27 MIL/uL (ref 4.22–5.81)
RDW: 13.2 % (ref 11.5–15.5)
WBC: 8.9 10*3/uL (ref 4.0–10.5)
nRBC: 0 % (ref 0.0–0.2)

## 2024-01-06 LAB — HEPARIN LEVEL (UNFRACTIONATED): Heparin Unfractionated: >1.1 [IU]/mL — ABNORMAL HIGH (ref 0.30–0.70)

## 2024-01-06 LAB — MAGNESIUM: Magnesium: 2 mg/dL (ref 1.7–2.4)

## 2024-01-06 MED ORDER — POTASSIUM CHLORIDE CRYS ER 20 MEQ PO TBCR
40.0000 meq | EXTENDED_RELEASE_TABLET | Freq: Once | ORAL | Status: AC
  Administered 2024-01-06: 40 meq via ORAL
  Filled 2024-01-06: qty 2

## 2024-01-06 MED ORDER — APIXABAN 5 MG PO TABS
5.0000 mg | ORAL_TABLET | Freq: Two times a day (BID) | ORAL | Status: DC
  Administered 2024-01-06 – 2024-01-14 (×16): 5 mg via ORAL
  Filled 2024-01-06 (×16): qty 1

## 2024-01-06 MED ORDER — POTASSIUM CHLORIDE CRYS ER 20 MEQ PO TBCR: 40.0000 meq | EXTENDED_RELEASE_TABLET | Freq: Once | ORAL | Status: DC

## 2024-01-06 NOTE — Plan of Care (Signed)
   Problem: Coping: Goal: Level of anxiety will decrease Outcome: Progressing   Problem: Pain Managment: Goal: General experience of comfort will improve and/or be controlled Outcome: Progressing   Problem: Safety: Goal: Ability to remain free from injury will improve Outcome: Progressing

## 2024-01-06 NOTE — Progress Notes (Signed)
 PHARMACY - ANTICOAGULATION CONSULT NOTE  Pharmacy Consult for Heparin (PTA apixaban) Indication: DVT  Allergies  Allergen Reactions   Iohexol      Code: SOB, Desc: ANAPHYLATIC SHOCK W/ IV CONTRAST/MMS     Patient Measurements: Height: 6' (182.9 cm) Weight: 83.4 kg (183 lb 14.4 oz) IBW/kg (Calculated) : 77.6 HEPARIN DW (KG): 83.4 Heparin dose weight: TBW  Vital Signs: Temp: 99 F (37.2 C) (03/31 0546) Temp Source: Oral (03/31 0546) BP: 115/68 (03/31 0546) Pulse Rate: 95 (03/31 0546)  Labs: Recent Labs    01/05/24 1704 01/06/24 0312 01/06/24 0717  HGB 15.3 13.4  --   HCT 43.2 40.6  --   PLT 222 189  --   APTT 29  --  52*  LABPROT 14.3  --   --   INR 1.1  --   --   HEPARINUNFRC  --   --  >1.10*  CREATININE 0.92 0.76  --     Estimated Creatinine Clearance: 78.1 mL/min (by C-G formula based on SCr of 0.76 mg/dL).   Medical History: Past Medical History:  Diagnosis Date   CAD (coronary artery disease)    Deep vein thrombosis (DVT) of left lower extremity (HCC) 01/05/2024   Hepatitis    Hep C   Mental disorder    PTSD   PTSD (post-traumatic stress disorder)    Stroke (HCC)     Medications:  Infusions:   heparin 1,400 Units/hr (01/05/24 2322)   vancomycin      Assessment: 83 yo M with LLE cellulitis and recurrent DVT.  On Eliquis PTA- reports compliance with medication and last took dose 3/30 ~ 10a.  Dopplers reveal + partially occlusive left lower extremity DVT involving the left common femoral vein, femoral vein and popliteal vein. Baseline labs: CBC WNL, Scr WNL, aPTT/INR WNL. Pharmacy consulted to dose IV heparin.  Hematology consulted to weigh in re: Eliquis treatment failure vs. compliance issue. Baseline heparin level not checked, but will assume it is elevated since pt is on Eliquis (drug-lab interaction).    aPTT level SUBtherapeutic on current IV heparin rate of 1400 units/hr CBC: Hgb 13.4, down. Plt 189, down - monitor closely Per RN, no  bleeding or issues noted  Goal of Therapy:  Heparin level 0.3-0.7 units/ml aPTT 66-102 seconds Monitor platelets by anticoagulation protocol: Yes   Plan:  Increase IV heparin from 1400 to 1550 units/hr Recheck aPTT in 8 hrs -->will use aPTT to monitor heparin until Eliquis is cleared and aPTT correlates with heparin level Daily heparin level, CBC while on heparin    Hessie Knows, PharmD, BCPS Secure Chat if ?s 01/06/2024 7:50 AM

## 2024-01-06 NOTE — Plan of Care (Signed)
   Problem: Activity: Goal: Risk for activity intolerance will decrease Outcome: Progressing   Problem: Pain Managment: Goal: General experience of comfort will improve and/or be controlled Outcome: Progressing   Problem: Safety: Goal: Ability to remain free from injury will improve Outcome: Progressing

## 2024-01-06 NOTE — Assessment & Plan Note (Signed)
 Continue IV heparin When there is improvement of the neuritis, transition to oral, apixaban Continue anticoagulation as able due to high risk of recurrence. [facility]. He was seen by [facility] before.

## 2024-01-06 NOTE — TOC Initial Note (Signed)
 Transition of Care [facility]) - Initial/Assessment Note    Patient Details  Name: [name] MRN: [id number] Date of Birth: 02-05-1941  [facility]) CM/SW Contact:    Adrian Prows, RN Phone Number: 01/06/2024, 10:54 AM  Clinical Narrative:                 Spoke w/ pt in room; pt says he lives at home; he plans to return at d/c; pt identified POC brother [name] ([phone]); pt says family will provide transportation; he verified insurance/PCP; pt denied SDOH risks; he has cane, and walker; pt says he does not have HH services, or home oxygen; TOC will follow.        Patient Goals and CMS Choice            Expected Discharge Plan and Services                                              Prior Living Arrangements/Services                       Activities of Daily Living   ADL Screening (condition at time of admission) Independently performs ADLs?: Yes (appropriate for developmental age) Is the patient deaf or have difficulty hearing?: No Does the patient have difficulty seeing, even when wearing glasses/contacts?: No Does the patient have difficulty concentrating, remembering, or making decisions?: No  Permission Sought/Granted                  Emotional Assessment              Admission diagnosis:  Cellulitis of left lower extremity [L03.116] Deep vein thrombosis (DVT) of proximal vein of left lower extremity, unspecified chronicity (HCC) [I82.4Y2] Patient Active Problem List   Diagnosis Date Noted   Cellulitis of left lower extremity 01/05/2024   Deep vein thrombosis (DVT) of left lower extremity (HCC) 01/05/2024   Hepatic cirrhosis (HCC) 01/05/2024   Anxiety 01/05/2024   Major depressive disorder, single episode, severe without psychotic features (HCC) 03/13/2017   Complicated migraine 01/07/2015   Expressive aphasia 01/07/2015   Occlusion and stenosis of vertebral artery 11/19/2014   TIA  (transient ischemic attack) 11/19/2014   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 - [address] AT [address] OF [facility] [address] [place] [address] Phone: [phone] Fax: [phone]     Social Drivers of Health (SDOH) Social History: SDOH Screenings   Food Insecurity: No Food Insecurity (01/06/2024)  Housing: Low Risk  (01/06/2024)  Transportation Needs: No Transportation Needs (01/06/2024)  Utilities: Not At Risk (01/06/2024)  Tobacco Use: Medium Risk (01/05/2024)   SDOH Interventions: Food Insecurity Interventions: Intervention Not Indicated, Inpatient TOC Housing Interventions: Intervention Not Indicated, Inpatient TOC Transportation Interventions: Intervention Not Indicated, Inpatient TOC Utilities Interventions: Intervention Not Indicated, Inpatient TOC   Readmission Risk Interventions     No data to display

## 2024-01-06 NOTE — Progress Notes (Signed)
 PHARMACY - ANTICOAGULATION CONSULT NOTE  Pharmacy Consult for Heparin (PTA apixaban) Indication: DVT  Allergies  Allergen Reactions   Iodine Anaphylaxis   Iohexol Anaphylaxis    Patient Measurements: Height: 6' (182.9 cm) Weight: 83.4 kg (183 lb 14.4 oz) IBW/kg (Calculated) : 77.6 HEPARIN DW (KG): 83.4 Heparin dose weight: TBW  Vital Signs: Temp: 98.9 F (37.2 C) (03/31 1247) Temp Source: Oral (03/31 1247) BP: 132/61 (03/31 1247) Pulse Rate: 88 (03/31 1247)  Labs: Recent Labs    01/05/24 1704 01/06/24 0312 01/06/24 0717 01/06/24 1540  HGB 15.3 13.4  --   --   HCT 43.2 40.6  --   --   PLT 222 189  --   --   APTT 29  --  52* >200*  LABPROT 14.3  --   --   --   INR 1.1  --   --   --   HEPARINUNFRC  --   --  >1.10*  --   CREATININE 0.92 0.76  --   --     Estimated Creatinine Clearance: 78.1 mL/min (by C-G formula based on SCr of 0.76 mg/dL).  Assessment: 83 yo M with LLE cellulitis and recurrent DVT.  On Eliquis PTA- reports compliance with medication and last took dose 3/30 ~ 10a.  Dopplers reveal + partially occlusive left lower extremity DVT involving the left common femoral vein, femoral vein and popliteal vein. Baseline labs: CBC WNL, Scr WNL, aPTT/INR WNL. Pharmacy consulted to dose IV heparin.  Hematology consulted to weigh in re: Eliquis treatment failure vs. compliance issue. Baseline heparin level not checked, but will assume it is elevated since pt is on Eliquis (drug-lab interaction).    aPTT level drawn at 1540 > 200  current IV heparin rate of 1550 units/hr. Heparin infusing in Left forearm and heparin level drawn from left arm> heparin level likely falsely elevated due to contamination with heparin, no bleeding per RN or patient report CBC: Hgb 13.4, down. Plt 189, down - monitor closely  Goal of Therapy:  Heparin level 0.3-0.7 units/ml aPTT 66-102 seconds Monitor platelets by anticoagulation protocol: Yes   Plan:  Continue heparin at 1550  units/hr now At 2200 DC heparin drip & resume Eliquis 5 mg po bid as ordered by MD DC daily CBC & heparin labs Pharmacy to sign off  Herby Abraham, Pharm.D Use secure chat for questions 01/06/2024 5:30 PM
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
## 2024-01-06 NOTE — Hospital Course (Addendum)
 83 year old man PMH including hepatitis C, cirrhosis, multiple DVTs, PE status post trauma 2 years ago, currently on apixaban, presented with worsening left lower extremity edema, redness and pain.  Admitted for left lower extremity cellulitis and acute DVT on apixaban.  Treated with antibiotics.  Concern for treatment failure with apixaban.  Consultants Hematology   Procedures/Events

## 2024-01-06 NOTE — Assessment & Plan Note (Signed)
 Acute inflammatory process will contribute to elevated risk of thrombosis as patient also has underlying risk factors. Recommend continue IV antibiotics Recommend outpatient wound care

## 2024-01-07 DIAGNOSIS — I824Y2 Acute embolism and thrombosis of unspecified deep veins of left proximal lower extremity: Secondary | ICD-10-CM | POA: Diagnosis not present

## 2024-01-07 DIAGNOSIS — L03116 Cellulitis of left lower limb: Secondary | ICD-10-CM | POA: Diagnosis not present

## 2024-01-07 LAB — BASIC METABOLIC PANEL WITH GFR
Anion gap: 8 (ref 5–15)
BUN: 9 mg/dL (ref 8–23)
CO2: 20 mmol/L — ABNORMAL LOW (ref 22–32)
Calcium: 8.6 mg/dL — ABNORMAL LOW (ref 8.9–10.3)
Chloride: 99 mmol/L (ref 98–111)
Creatinine, Ser: 0.84 mg/dL (ref 0.61–1.24)
GFR, Estimated: >60 mL/min (ref >60)
Glucose, Bld: 94 mg/dL (ref 70–99)
Potassium: 3.6 mmol/L (ref 3.5–5.1)
Sodium: 127 mmol/L — ABNORMAL LOW (ref 135–145)

## 2024-01-07 MED ORDER — SODIUM CHLORIDE 0.9 % IV SOLN: INTRAVENOUS | Status: DC

## 2024-01-07 MED ORDER — TAMSULOSIN HCL 0.4 MG PO CAPS
0.4000 mg | ORAL_CAPSULE | Freq: Every day | ORAL | Status: DC
  Administered 2024-01-07 – 2024-01-13 (×7): 0.4 mg via ORAL
  Filled 2024-01-07 (×7): qty 1

## 2024-01-07 MED ORDER — ALUM & MAG HYDROXIDE-SIMETH 200-200-20 MG/5ML PO SUSP
30.0000 mL | Freq: Four times a day (QID) | ORAL | Status: DC | PRN
  Administered 2024-01-07: 30 mL via ORAL
  Filled 2024-01-07 (×2): qty 30

## 2024-01-07 MED ORDER — NETARSUDIL-LATANOPROST 0.02-0.005 % OP SOLN: 1.0000 [drp] | Freq: Every day | OPHTHALMIC | Status: DC

## 2024-01-07 MED ORDER — TIZANIDINE HCL 4 MG PO TABS
2.0000 mg | ORAL_TABLET | Freq: Once | ORAL | Status: AC
  Administered 2024-01-07: 2 mg via ORAL
  Filled 2024-01-07: qty 1

## 2024-01-07 MED ORDER — SODIUM CHLORIDE 0.9 % IV SOLN
3.0000 g | Freq: Four times a day (QID) | INTRAVENOUS | Status: AC
  Administered 2024-01-07 – 2024-01-14 (×28): 3 g via INTRAVENOUS
  Filled 2024-01-07 (×28): qty 8

## 2024-01-07 MED ORDER — CLONAZEPAM 1 MG PO TABS
1.0000 mg | ORAL_TABLET | Freq: Every day | ORAL | Status: DC
  Administered 2024-01-07 – 2024-01-13 (×7): 1 mg via ORAL
  Filled 2024-01-07 (×7): qty 1

## 2024-01-07 MED ORDER — LATANOPROST 0.005 % OP SOLN
1.0000 [drp] | Freq: Every day | OPHTHALMIC | Status: DC
  Administered 2024-01-07 – 2024-01-13 (×7): 1 [drp] via OPHTHALMIC
  Filled 2024-01-07: qty 2.5

## 2024-01-07 MED ORDER — SODIUM BICARBONATE 8.4 % IV SOLN
INTRAVENOUS | Status: AC
  Filled 2024-01-07: qty 150
  Filled 2024-01-07: qty 1000
  Filled 2024-01-07: qty 150
  Filled 2024-01-07: qty 1000

## 2024-01-07 NOTE — Plan of Care (Signed)
  Problem: Education: Goal: Knowledge of General Education information will improve Description: Including pain rating scale, medication(s)/side effects and non-pharmacologic comfort measures Outcome: Progressing   Problem: Health Behavior/Discharge Planning: Goal: Ability to manage health-related needs will improve Outcome: Progressing   Problem: Clinical Measurements: Goal: Ability to maintain clinical measurements within normal limits will improve Outcome: Progressing Goal: Will remain free from infection Outcome: Progressing Goal: Diagnostic test results will improve Outcome: Progressing Goal: Respiratory complications will improve Outcome: Progressing Goal: Cardiovascular complication will be avoided Outcome: Progressing   Problem: Activity: Goal: Risk for activity intolerance will decrease Outcome: Progressing   Problem: Nutrition: Goal: Adequate nutrition will be maintained Outcome: Progressing   Problem: Coping: Goal: Level of anxiety will decrease Outcome: Progressing   Problem: Elimination: Goal: Will not experience complications related to bowel motility Outcome: Completed/Met Goal: Will not experience complications related to urinary retention Outcome: Adequate for Discharge   Problem: Pain Managment: Goal: General experience of comfort will improve and/or be controlled Outcome: Progressing   Problem: Safety: Goal: Ability to remain free from injury will improve Outcome: Progressing   Problem: Skin Integrity: Goal: Risk for impaired skin integrity will decrease Outcome: Progressing   Problem: Clinical Measurements: Goal: Ability to avoid or minimize complications of infection will improve Outcome: Progressing   Problem: Skin Integrity: Goal: Skin integrity will improve Outcome: Progressing

## 2024-01-07 NOTE — Progress Notes (Signed)
 Pharmacy Antibiotic Note  [name] is a 83 y.o. male admitted on 01/05/2024 with LLE cellulitis.  Pharmacy  been consulted for Vancomycin dosing on 3/10 & on 4/1 pharmacy consulted for Unasyn dosing.  01/07/2024 Day #3 vancomycin.  To add Unasyn WBC WNL. SCr WNL, Tm 99.2   Plan: Start Unasyn 3 gm IV q6h Continue Vancomycin 1750mg  IV q24h to target AUC 400-550.  Estimated AUC on this regimen = 480. Monitor renal function and cx data   Height: 6' (182.9 cm) Weight: 83.4 kg (183 lb 14.4 oz) IBW/kg (Calculated) : 77.6  Temp (24hrs), Avg:98.6 F (37 C), Min:98.1 F (36.7 C), Max:99.2 F (37.3 C)  Recent Labs  Lab 01/05/24 1704 01/06/24 0312 01/07/24 0344  WBC 10.5 8.9  --   CREATININE 0.92 0.76 0.84  LATICACIDVEN 1.4  --   --     Estimated Creatinine Clearance: 74.4 mL/min (by C-G formula based on SCr of 0.84 mg/dL).    Allergies  Allergen Reactions   Iodine Anaphylaxis   Iohexol Anaphylaxis    Antimicrobials this admission: 3/30 Vancomycin >>  4/1 Unasyn>> Dose adjustments this admission:  Microbiology results: 3/30 BCx: ngtd 3/30 Resp PCR: negative  Thank you for allowing pharmacy to be a part of this patient's care.  Herby Abraham, Pharm.D Use secure chat for questions 01/07/2024 11:44 AM

## 2024-01-07 NOTE — Consult Note (Addendum)
 WOC Nurse Consult Note: Reason for Consult: Consult requested for left leg.  Performed remotely after review of progress notes and photos in the EMR.  Left anterior calf with cellulitis and red moist macerated areas of patchy partial thickness skin loss and black dry scattered scabs in the middle with yellow blisters surrounding. Generalized edema and erythremia to LLE.  Dressing procedure/placement/frequency: Pt is already being treated with systemic antibiotics for the cellulitis.  Topical treatment orders provided for bedside nurses to perform as follows to promote moist healing: Apply Xeroform gauze to left leg wound Q day, then cover with ABD pad and kerlex. Please re-consult if further assistance is needed.  Thank-you,  Cammie Mcgee MSN, RN, CWOCN, [place], CNS [phone]

## 2024-01-07 NOTE — Progress Notes (Addendum)
  Progress Note   Patient: [name] [id number]:[id number] DOB: 07/14/1941 DOA: 01/05/2024     2 DOS: the patient was seen and examined on 01/07/2024   Brief hospital course: 83 year old man PMH including hepatitis C, cirrhosis, multiple DVTs, PE status post trauma 2 years ago, currently on apixaban, presented with worsening left lower extremity edema, redness and pain.  Admitted for left lower extremity cellulitis and acute DVT on apixaban.  Treated with antibiotics.  Concern for treatment failure with apixaban.  Consultants Hematology   Procedures/Events    Assessment and Plan: LLE cellulitis  No significant improvement.  Add Unasyn.  Place TED hose.  Wound care.   LLE DVT  On apixaban.  Has a history of DVT and PE.   Receives all care at [facility]. records in care everywhere.  Discussed with Dr. Cherly Hensen.  Continue apixaban, this is not considered a treatment failure.  Hyponatremia  Start normal saline check BMP in AM.   Cirrhosis  Hepatitis C Stable   Anxiety  Continue Klonopin as-needed   Hypokalemia 3/31 Repleted   Subjective:  Feels about the same, continues to have pain in left leg, and actually feels like it might be worse  Physical Exam: Vitals:   01/06/24 2231 01/07/24 0538 01/07/24 1026 01/07/24 1327  BP: 119/66 117/73 118/73 117/68  Pulse: 88 92 88 83  Resp: 18 17 16 16   Temp: 98.1 F (36.7 C) 99.2 F (37.3 C) 98.2 F (36.8 C) 98.3 F (36.8 C)  TempSrc:  Oral Oral   SpO2: 97% 94% 98% 97%  Weight:      Height:       Physical Exam Vitals reviewed.  Constitutional:      General: He is not in acute distress.    Appearance: He is not ill-appearing or toxic-appearing.  Cardiovascular:     Rate and Rhythm: Normal rate and regular rhythm.     Heart sounds: No murmur heard. Pulmonary:     Effort: Pulmonary effort is normal. No respiratory distress.     Breath sounds: No wheezing, rhonchi or rales.  Musculoskeletal:     Comments: Significant edema left lower  extremity with some erythema about the same today.  Anterior lower leg lesion is crusting over  Neurological:     Mental Status: He is alert.  Psychiatric:        Mood and Affect: Mood normal.        Behavior: Behavior normal.     Data Reviewed: Sodium down a bit 127, CO2 20  Family Communication: none  Disposition: Status is: Inpatient Remains inpatient appropriate because: cellulitis     Time spent: 20 minutes  Author: Brendia Sacks, MD 01/07/2024 5:28 PM  For on call review [email or web address].

## 2024-01-07 NOTE — Progress Notes (Signed)
 Patient requested personal belongings back from security (wallet and keys). Belongings that were in security envelope were handed to patient at bedside.

## 2024-01-07 NOTE — Progress Notes (Signed)
       Overnight   NAME: [name] MRN: [id number] DOB : [birth year]    Date of Service   01/07/2024   HPI/Events of Note    Notified by RN for pt and Pharmacy inquiry of Eye drops . Patient does not have ability to have drops brought from home. His type and dosing are not available currently.    Interventions/ Plan   Substitute Pharm for stay here. Will reorder if patient supply becomes available from patient      Chinita Greenland BSN MSNA MSN ACNPC-AG Acute Care Nurse Practitioner [facility]

## 2024-01-08 DIAGNOSIS — L03116 Cellulitis of left lower limb: Secondary | ICD-10-CM | POA: Diagnosis not present

## 2024-01-08 LAB — BASIC METABOLIC PANEL WITH GFR
Anion gap: 9 (ref 5–15)
BUN: 13 mg/dL (ref 8–23)
CO2: 28 mmol/L (ref 22–32)
Calcium: 8.4 mg/dL — ABNORMAL LOW (ref 8.9–10.3)
Chloride: 94 mmol/L — ABNORMAL LOW (ref 98–111)
Creatinine, Ser: 0.71 mg/dL (ref 0.61–1.24)
GFR, Estimated: >60 mL/min (ref >60)
Glucose, Bld: 213 mg/dL — ABNORMAL HIGH (ref 70–99)
Potassium: 3.2 mmol/L — ABNORMAL LOW (ref 3.5–5.1)
Sodium: 131 mmol/L — ABNORMAL LOW (ref 135–145)

## 2024-01-08 MED ORDER — POTASSIUM CHLORIDE 10 MEQ/100ML IV SOLN
10.0000 meq | INTRAVENOUS | Status: AC
  Administered 2024-01-08 (×4): 10 meq via INTRAVENOUS
  Filled 2024-01-08 (×4): qty 100

## 2024-01-08 MED ORDER — PROCHLORPERAZINE EDISYLATE 10 MG/2ML IJ SOLN
10.0000 mg | Freq: Four times a day (QID) | INTRAMUSCULAR | Status: DC | PRN
Start: 2024-01-08 — End: 2024-01-14
  Administered 2024-01-08 – 2024-01-11 (×2): 10 mg via INTRAVENOUS
  Filled 2024-01-08 (×3): qty 2

## 2024-01-08 MED ORDER — CALCIUM CARBONATE ANTACID 500 MG PO CHEW
1.0000 | CHEWABLE_TABLET | Freq: Once | ORAL | Status: AC
  Administered 2024-01-08: 200 mg via ORAL

## 2024-01-08 MED ORDER — SIMETHICONE 80 MG PO CHEW
80.0000 mg | CHEWABLE_TABLET | Freq: Once | ORAL | Status: AC
  Administered 2024-01-08: 80 mg via ORAL
  Filled 2024-01-08: qty 1

## 2024-01-08 NOTE — Progress Notes (Signed)
 Dressing changed LLE

## 2024-01-08 NOTE — Plan of Care (Signed)
  Problem: Education: Goal: Knowledge of General Education information will improve Description: Including pain rating scale, medication(s)/side effects and non-pharmacologic comfort measures Outcome: Progressing   Problem: Health Behavior/Discharge Planning: Goal: Ability to manage health-related needs will improve Outcome: Progressing   Problem: Clinical Measurements: Goal: Ability to maintain clinical measurements within normal limits will improve Outcome: Progressing Goal: Will remain free from infection Outcome: Progressing Goal: Diagnostic test results will improve Outcome: Progressing Goal: Respiratory complications will improve Outcome: Progressing Goal: Cardiovascular complication will be avoided Outcome: Progressing   Problem: Activity: Goal: Risk for activity intolerance will decrease Outcome: Progressing   Problem: Nutrition: Goal: Adequate nutrition will be maintained Outcome: Progressing   Problem: Coping: Goal: Level of anxiety will decrease Outcome: Progressing   Problem: Elimination: Goal: Will not experience complications related to urinary retention Outcome: Progressing   Problem: Pain Managment: Goal: General experience of comfort will improve and/or be controlled Outcome: Progressing   Problem: Safety: Goal: Ability to remain free from injury will improve Outcome: Progressing   Problem: Skin Integrity: Goal: Risk for impaired skin integrity will decrease Outcome: Progressing   Problem: Clinical Measurements: Goal: Ability to avoid or minimize complications of infection will improve Outcome: Progressing   Problem: Skin Integrity: Goal: Skin integrity will improve Outcome: Progressing

## 2024-01-08 NOTE — Progress Notes (Signed)
  Progress Note   Patient: [name]:[id number] DOB: [birth year] DOA: 01/05/2024     3 DOS: the patient was seen and examined on 01/08/2024   Brief hospital course: 83 year old man PMH including hepatitis C, cirrhosis, multiple DVTs, PE status post trauma 2 years ago, currently on apixaban, presented with worsening left lower extremity edema, redness and pain.  Admitted for left lower extremity cellulitis and acute DVT on apixaban.  Slowly improving with abx.    Consultants Hematology     Assessment and Plan: LLE cellulitis  IV abx  Wound care. Needs to elevate extremity   LLE DVT  On apixaban.  Has a history of DVT and PE.   Receives all care at [facility]. records in care everywhere.  Dr. Irene Limbo discussed with Dr. Cherly Hensen.  Continue apixaban, this is not considered a treatment failure.  Hyponatremia  Improved with IVF   Cirrhosis  Hepatitis C Stable   Anxiety  Continue Klonopin as-needed   Hypokalemia 3/31 Replete  Nausea and vomiting -add compazine  Subjective:  Leg feels better but had nausea and vomiting this AM   Physical Exam: Vitals:   01/07/24 1327 01/07/24 2148 01/08/24 0452 01/08/24 0733  BP: 117/68 (!) 153/71 (!) 140/70 (!) 129/51  Pulse: 83 79 78 (!) 54  Resp: 16 18 16 18   Temp: 98.3 F (36.8 C) 98.7 F (37.1 C) 98.7 F (37.1 C) 98 F (36.7 C)  TempSrc:  Oral Oral Oral  SpO2: 97% 95% 92% 94%  Weight:      Height:        General: Appearance:    Well developed, well nourished male in no acute distress   Swelling of left leg with redness around wrapping  Lungs:    respirations unlabored  Heart:    Bradycardic.   MS:   All extremities are intact.   Neurologic:   Awake, alert     Data Reviewed: Na rising   Family Communication: none  Disposition: Status is: Inpatient Remains inpatient appropriate because: cellulitis   Time spent: 35 minutes  Author: Joseph Art, DO 01/08/2024 8:32 AM  For on call review [email or web address].
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
## 2024-01-09 DIAGNOSIS — L03116 Cellulitis of left lower limb: Secondary | ICD-10-CM | POA: Diagnosis not present

## 2024-01-09 MED ORDER — CARMEX CLASSIC LIP BALM EX OINT
TOPICAL_OINTMENT | CUTANEOUS | Status: DC | PRN
  Filled 2024-01-09: qty 10

## 2024-01-09 NOTE — Plan of Care (Signed)
  Problem: Education: Goal: Knowledge of General Education information will improve Description: Including pain rating scale, medication(s)/side effects and non-pharmacologic comfort measures Outcome: Progressing   Problem: Health Behavior/Discharge Planning: Goal: Ability to manage health-related needs will improve Outcome: Progressing   Problem: Clinical Measurements: Goal: Ability to maintain clinical measurements within normal limits will improve Outcome: Progressing Goal: Will remain free from infection Outcome: Progressing Goal: Diagnostic test results will improve Outcome: Progressing Goal: Respiratory complications will improve Outcome: Progressing Goal: Cardiovascular complication will be avoided Outcome: Progressing   Problem: Activity: Goal: Risk for activity intolerance will decrease Outcome: Progressing   Problem: Nutrition: Goal: Adequate nutrition will be maintained Outcome: Progressing   Problem: Coping: Goal: Level of anxiety will decrease Outcome: Progressing   Problem: Elimination: Goal: Will not experience complications related to urinary retention Outcome: Progressing   Problem: Pain Managment: Goal: General experience of comfort will improve and/or be controlled Outcome: Progressing   Problem: Safety: Goal: Ability to remain free from injury will improve Outcome: Progressing   Problem: Skin Integrity: Goal: Risk for impaired skin integrity will decrease Outcome: Progressing   Problem: Clinical Measurements: Goal: Ability to avoid or minimize complications of infection will improve Outcome: Progressing   Problem: Skin Integrity: Goal: Skin integrity will improve Outcome: Progressing

## 2024-01-09 NOTE — Progress Notes (Signed)
 Physical Therapy Treatment Patient Details Name: [name] MRN: [id number] DOB: Jan 07, 1941 Today's Date: 01/09/2024   History of Present Illness 82-yo man presented with worsening left lower extremity edema, redness and pain. Admitted for left lower extremity cellulitis and acute DVT on apixaban. PMH including hepatitis C, cirrhosis, multiple DVTs, PE status post trauma 2 years ago, currently on apixaban,    PT Comments  Pt agreeable to mobilize however continues to report pain in L LE limiting being ambulatory.  Pt assisted with step pivot over to recliner and elevated LEs with pillows.  Pt reports being motivated and eager to return to previous independence as soon as possible.    If plan is discharge home, recommend the following: A little help with walking and/or transfers;A little help with bathing/dressing/bathroom;Help with stairs or ramp for entrance   Can travel by private vehicle        Equipment Recommendations  None recommended by PT    Recommendations for Other Services       Precautions / Restrictions Precautions Precautions: Fall Recall of Precautions/Restrictions: Intact Restrictions Weight Bearing Restrictions Per Provider Order: No     Mobility  Bed Mobility Overal bed mobility: Needs Assistance Bed Mobility: Supine to Sit     Supine to sit: Supervision, HOB elevated          Transfers Overall transfer level: Needs assistance Equipment used: Rolling walker (2 wheels) Transfers: Sit to/from Stand, Bed to chair/wheelchair/BSC Sit to Stand: Min assist   Step pivot transfers: Min assist       General transfer comment: cues for hand placement and to power up with RLE, assist to rise and control descent; assist to balance/steady for stand step pivot with cues for use of UEs to offload LLE    Ambulation/Gait               General Gait Details: pt declined due to pain with L LE dependent position   Stairs             Wheelchair  Mobility     Tilt Bed    Modified Rankin (Stroke Patients Only)       Balance Overall balance assessment: Needs assistance         Standing balance support: Reliant on assistive device for balance, During functional activity Standing balance-Leahy Scale: Poor                              Communication Communication Communication: No apparent difficulties  Cognition Arousal: Alert Behavior During Therapy: WFL for tasks assessed/performed   PT - Cognitive impairments: No apparent impairments                         Following commands: Intact      Cueing Cueing Techniques: Verbal cues  Exercises      General Comments        Pertinent Vitals/Pain Pain Assessment Pain Assessment: Faces Faces Pain Scale: Hurts little more Pain Location: L LE Pain Descriptors / Indicators: Discomfort, Grimacing, Heaviness Pain Intervention(s): Monitored during session, Repositioned    Home Living                          Prior Function            PT Goals (current goals can now be found in the care plan section) Progress towards PT goals: Progressing  toward goals    Frequency    Min 3X/week      PT Plan      Co-evaluation              AM-PAC PT "6 Clicks" Mobility   Outcome Measure  Help needed turning from your back to your side while in a flat bed without using bedrails?: A Little Help needed moving from lying on your back to sitting on the side of a flat bed without using bedrails?: A Little Help needed moving to and from a bed to a chair (including a wheelchair)?: A Little Help needed standing up from a chair using your arms (e.g., wheelchair or bedside chair)?: A Little Help needed to walk in hospital room?: A Little Help needed climbing 3-5 steps with a railing? : A Little 6 Click Score: 18    End of Session Equipment Utilized During Treatment: Gait belt Activity Tolerance: Patient tolerated treatment well Patient  left: in chair;with call bell/phone within reach;with chair alarm set   PT Visit Diagnosis: Other abnormalities of gait and mobility (R26.89);History of falling (Z91.81)     Time: 1610-9604 PT Time Calculation (min) (ACUTE ONLY): 12 min  Charges:    $Therapeutic Activity: 8-22 mins PT General Charges $$ ACUTE PT VISIT: 1 Visit                     Thomasene Mohair PT, DPT Physical Therapist Acute Rehabilitation Services Office: [phone]    Janan Halter [place] 01/09/2024, 1:31 PM

## 2024-01-09 NOTE — Progress Notes (Signed)
  Progress Note   Patient: [name] ZOX:[id number] DOB: 1941-06-11 DOA: 01/05/2024     4 DOS: the patient was seen and examined on 01/09/2024   Brief hospital course: 83 year old man PMH including hepatitis C, cirrhosis, multiple DVTs, PE status post trauma 2 years ago, currently on apixaban, presented with worsening left lower extremity edema, redness and pain.  Admitted for left lower extremity cellulitis and acute DVT on apixaban.  Slowly improving with abx.    Consultants Hematology     Assessment and Plan: LLE cellulitis  IV abx  Wound care. Swelling improved with  elevate extremity   LLE DVT  On apixaban.  Has a history of DVT and PE.   Receives all care at [facility]. records in care everywhere.  Dr. Irene Limbo discussed with Dr. Cherly Hensen:  Continue apixaban, this is not considered a treatment failure.  Hyponatremia  Improved with IVF   Cirrhosis  Hepatitis C Stable   Anxiety  Continue Klonopin as-needed   Hypokalemia 3/31 Replete  Nausea and vomiting -added compazine  Subjective:  No further nausea or vomiting   Physical Exam: Vitals:   01/08/24 1441 01/08/24 2214 01/09/24 0626 01/09/24 0952  BP: 108/61 108/67 123/65 123/65  Pulse: 82 82 79 87  Resp: 18 18 18 16   Temp: 98 F (36.7 C) 98.4 F (36.9 C) 98.3 F (36.8 C) 97.8 F (36.6 C)  TempSrc:   Oral Oral  SpO2: 97% 95% 96% 95%  Weight:      Height:        General: Appearance:    Well developed, well nourished male in no acute distress     Lungs:    respirations unlabored     MS:   All extremities are intact. Swelling improved  Neurologic:   Awake, alert     Data Reviewed: Labs not done today  Family Communication: none  Disposition: Status is: Inpatient Remains inpatient appropriate because: cellulitis    Time spent: 35 minutes  Author: Joseph Art, DO 01/09/2024 10:05 AM  For on call review [email or web address].

## 2024-01-10 DIAGNOSIS — L03116 Cellulitis of left lower limb: Secondary | ICD-10-CM | POA: Diagnosis not present

## 2024-01-10 LAB — CULTURE, BLOOD (ROUTINE X 2)
Culture: NO GROWTH
Culture: NO GROWTH
Special Requests: ADEQUATE
Special Requests: ADEQUATE

## 2024-01-10 LAB — CBC
HCT: 33.5 % — ABNORMAL LOW (ref 39.0–52.0)
Hemoglobin: 11.3 g/dL — ABNORMAL LOW (ref 13.0–17.0)
MCH: 31.7 pg (ref 26.0–34.0)
MCHC: 33.7 g/dL (ref 30.0–36.0)
MCV: 94.1 fL (ref 80.0–100.0)
Platelets: 245 10*3/uL (ref 150–400)
RBC: 3.56 MIL/uL — ABNORMAL LOW (ref 4.22–5.81)
RDW: 13.2 % (ref 11.5–15.5)
WBC: 5.4 10*3/uL (ref 4.0–10.5)
nRBC: 0 % (ref 0.0–0.2)

## 2024-01-10 LAB — BASIC METABOLIC PANEL WITH GFR
Anion gap: 9 (ref 5–15)
BUN: 12 mg/dL (ref 8–23)
CO2: 22 mmol/L (ref 22–32)
Calcium: 8.3 mg/dL — ABNORMAL LOW (ref 8.9–10.3)
Chloride: 103 mmol/L (ref 98–111)
Creatinine, Ser: 0.83 mg/dL (ref 0.61–1.24)
GFR, Estimated: >60 mL/min (ref >60)
Glucose, Bld: 99 mg/dL (ref 70–99)
Potassium: 3.6 mmol/L (ref 3.5–5.1)
Sodium: 134 mmol/L — ABNORMAL LOW (ref 135–145)

## 2024-01-10 MED ORDER — VANCOMYCIN HCL IN DEXTROSE 1-5 GM/200ML-% IV SOLN
1000.0000 mg | Freq: Two times a day (BID) | INTRAVENOUS | Status: AC
  Administered 2024-01-10 – 2024-01-12 (×5): 1000 mg via INTRAVENOUS
  Filled 2024-01-10 (×5): qty 200

## 2024-01-10 NOTE — Plan of Care (Signed)
  Problem: Education: Goal: Knowledge of General Education information will improve Description: Including pain rating scale, medication(s)/side effects and non-pharmacologic comfort measures Outcome: Progressing   Problem: Health Behavior/Discharge Planning: Goal: Ability to manage health-related needs will improve Outcome: Progressing   Problem: Clinical Measurements: Goal: Ability to maintain clinical measurements within normal limits will improve Outcome: Progressing Goal: Will remain free from infection Outcome: Progressing Goal: Diagnostic test results will improve Outcome: Progressing Goal: Respiratory complications will improve Outcome: Progressing Goal: Cardiovascular complication will be avoided Outcome: Progressing   Problem: Activity: Goal: Risk for activity intolerance will decrease Outcome: Progressing   Problem: Nutrition: Goal: Adequate nutrition will be maintained Outcome: Progressing   Problem: Coping: Goal: Level of anxiety will decrease Outcome: Progressing   Problem: Elimination: Goal: Will not experience complications related to urinary retention Outcome: Progressing   Problem: Pain Managment: Goal: General experience of comfort will improve and/or be controlled Outcome: Progressing   Problem: Safety: Goal: Ability to remain free from injury will improve Outcome: Progressing   Problem: Skin Integrity: Goal: Risk for impaired skin integrity will decrease Outcome: Progressing   Problem: Clinical Measurements: Goal: Ability to avoid or minimize complications of infection will improve Outcome: Progressing   Problem: Skin Integrity: Goal: Skin integrity will improve Outcome: Progressing
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
## 2024-01-10 NOTE — Plan of Care (Signed)

## 2024-01-10 NOTE — Progress Notes (Signed)
 Pharmacy Antibiotic Note  [name] is a 83 y.o. male admitted on 01/05/2024 with cellulitis.  Pharmacy has been consulted for Vanco, Unasyn dosing.  ID: LLE cellulitis Afebrile, WBC WNL, Scr <1  Antimicrobials this admission: 3/30 Vancomycin >>  4/1 Unasyn>>  Dose adjustments this admission: -Vanc 1750mg  q24 (Scr 1, AUC 520) - 4/4: Vanco 1g IV q12 hrs (Scr 0.83, AUC 497)   Microbiology results: 3/30 BCx: ngtd  Plan: -Unasyn 3 gm q6h dose ok - Change to Vancomycin 1000 mg IV Q 12 hrs. Goal AUC 400-550. Expected AUC: 497, SCr used: 0.83    Height: 6' (182.9 cm) Weight: 83.4 kg (183 lb 14.4 oz) IBW/kg (Calculated) : 77.6  Temp (24hrs), Avg:97.7 F (36.5 C), Min:97.3 F (36.3 C), Max:98.2 F (36.8 C)  Recent Labs  Lab 01/05/24 1704 01/06/24 0312 01/07/24 0344 01/08/24 0331 01/10/24 0321  WBC 10.5 8.9  --   --  5.4  CREATININE 0.92 0.76 0.84 0.71 0.83  LATICACIDVEN 1.4  --   --   --   --     Estimated Creatinine Clearance: 75.3 mL/min (by C-G formula based on SCr of 0.83 mg/dL).    Allergies  Allergen Reactions   Iodine Anaphylaxis   Iohexol Anaphylaxis   Jevaughn Degollado S. Merilynn Finland, PharmD, BCPS Clinical Staff Pharmacist  Misty Stanley [facility] 01/10/2024 8:37 AM

## 2024-01-10 NOTE — Plan of Care (Signed)
  Problem: Education: Goal: Knowledge of General Education information will improve Description: Including pain rating scale, medication(s)/side effects and non-pharmacologic comfort measures Outcome: Progressing   Problem: Health Behavior/Discharge Planning: Goal: Ability to manage health-related needs will improve Outcome: Progressing   Problem: Clinical Measurements: Goal: Ability to maintain clinical measurements within normal limits will improve Outcome: Progressing Goal: Will remain free from infection Outcome: Progressing Goal: Diagnostic test results will improve Outcome: Progressing Goal: Respiratory complications will improve Outcome: Progressing Goal: Cardiovascular complication will be avoided Outcome: Progressing   Problem: Activity: Goal: Risk for activity intolerance will decrease Outcome: Progressing   Problem: Nutrition: Goal: Adequate nutrition will be maintained Outcome: Completed/Met   Problem: Coping: Goal: Level of anxiety will decrease Outcome: Progressing   Problem: Elimination: Goal: Will not experience complications related to urinary retention Outcome: Completed/Met   Problem: Pain Managment: Goal: General experience of comfort will improve and/or be controlled Outcome: Progressing   Problem: Safety: Goal: Ability to remain free from injury will improve Outcome: Progressing   Problem: Skin Integrity: Goal: Risk for impaired skin integrity will decrease Outcome: Progressing   Problem: Clinical Measurements: Goal: Ability to avoid or minimize complications of infection will improve Outcome: Progressing   Problem: Skin Integrity: Goal: Skin integrity will improve Outcome: Progressing

## 2024-01-10 NOTE — Progress Notes (Signed)
  Progress Note   Patient: [name] [id number] DOB: 1941/06/01 DOA: 01/05/2024     5 DOS: the patient was seen and examined on 01/10/2024   Brief hospital course: 83 year old man PMH including hepatitis C, cirrhosis, multiple DVTs, PE status post trauma 2 years ago, currently on apixaban, presented with worsening left lower extremity edema, redness and pain.  Admitted for left lower extremity cellulitis and acute DVT on apixaban.  Slowly improving with abx.    Consultants Hematology     Assessment and Plan: LLE cellulitis  IV abx-- ? A venous insuff component with the redness  Wound care. Needs to continue elevating extremities   LLE DVT  On apixaban.  Has a history of DVT and PE.   Receives all care at [facility]. records in care everywhere.  Dr. Irene Limbo discussed with Dr. Cherly Hensen:  Continue apixaban, this is not considered a treatment failure.  Hyponatremia  Improved with IVF   Cirrhosis  Hepatitis C Stable   Anxiety  Continue Klonopin as-needed   Hypokalemia 3/31 Replete  Nausea and vomiting -added compazine with resolution      Subjective:  Has not been elevating the foot   Physical Exam: Vitals:   01/09/24 0952 01/09/24 1407 01/09/24 2143 01/10/24 0538  BP: 123/65 (!) 122/91 (!) 129/102 134/73  Pulse: 87 81 90 80  Resp: 16 18 16 17   Temp: 97.8 F (36.6 C) (!) 97.3 F (36.3 C) 97.6 F (36.4 C) 98.2 F (36.8 C)  TempSrc: Oral Oral Oral Oral  SpO2: 95% 96% 96% 94%  Weight:      Height:        General: Appearance:    Well developed, well nourished male in no acute distress     Lungs:    respirations unlabored   <-------------------  MS:   All extremities are intact. Swelling improved  Neurologic:   Awake, alert       Family Communication: none  Disposition: Status is: Inpatient Remains inpatient appropriate because: cellulitis   Time spent: 35 minutes  Author: Joseph Art, DO 01/10/2024 12:01 PM  For on call review  [email or web address].

## 2024-01-11 ENCOUNTER — Inpatient Hospital Stay (HOSPITAL_COMMUNITY)

## 2024-01-11 DIAGNOSIS — L03116 Cellulitis of left lower limb: Secondary | ICD-10-CM | POA: Diagnosis not present

## 2024-01-11 LAB — CREATININE, SERUM
Creatinine, Ser: 0.86 mg/dL (ref 0.61–1.24)
GFR, Estimated: >60 mL/min (ref >60)

## 2024-01-11 MED ORDER — SENNOSIDES-DOCUSATE SODIUM 8.6-50 MG PO TABS
2.0000 | ORAL_TABLET | Freq: Every day | ORAL | Status: DC
  Administered 2024-01-11 – 2024-01-13 (×3): 2 via ORAL
  Filled 2024-01-11 (×3): qty 2

## 2024-01-11 MED ORDER — BISACODYL 5 MG PO TBEC
5.0000 mg | DELAYED_RELEASE_TABLET | Freq: Once | ORAL | Status: AC
  Administered 2024-01-11: 5 mg via ORAL
  Filled 2024-01-11: qty 1

## 2024-01-11 MED ORDER — CALCIUM CARBONATE ANTACID 500 MG PO CHEW
1.0000 | CHEWABLE_TABLET | Freq: Three times a day (TID) | ORAL | Status: DC | PRN
  Administered 2024-01-11 – 2024-01-12 (×2): 200 mg via ORAL
  Filled 2024-01-11 (×2): qty 1

## 2024-01-11 MED ORDER — PANTOPRAZOLE SODIUM 40 MG PO TBEC
40.0000 mg | DELAYED_RELEASE_TABLET | Freq: Every day | ORAL | Status: DC
  Administered 2024-01-11 – 2024-01-14 (×4): 40 mg via ORAL
  Filled 2024-01-11 (×4): qty 1

## 2024-01-11 NOTE — Progress Notes (Signed)
  Progress Note   Patient: [name] [id number] DOB: [birth year] DOA: 01/05/2024     6 DOS: the patient was seen and examined on 01/11/2024   Brief hospital course: 83 year old man PMH including hepatitis C, cirrhosis, multiple DVTs, PE status post trauma 2 years ago, currently on apixaban, presented with worsening left lower extremity edema, redness and pain.  Admitted for left lower extremity cellulitis and acute DVT on apixaban.  Slowly improving with abx.    Consultants Hematology     Assessment and Plan: LLE cellulitis  -IV abx-- ? A venous insuff component with the redness- no fever, no WBC -Wound care. -Needs to continue elevating extremities   LLE DVT  -On apixaban.  Has a history of DVT and PE.   -Receives all care at [facility]. records in care everywhere.  Dr. Irene Limbo discussed with Dr. Cherly Hensen:  Continue apixaban, this is not considered a treatment failure.  Hyponatremia  -Improved with IVF   Cirrhosis  Hepatitis C -stable   Anxiety  -Continue Klonopin as-needed   Hypokalemia 3/31 -Replete  Nausea and vomiting -added compazine with resolution      Subjective:  Placed several pillows under foot to elevate   Physical Exam: Vitals:   01/10/24 1359 01/10/24 2032 01/11/24 0546 01/11/24 1318  BP: (!) 142/90 139/76 (!) 144/84 (!) 157/98  Pulse: 76 81 72   Resp: 17 17 17 17   Temp: 98.2 F (36.8 C) 98 F (36.7 C) 98.3 F (36.8 C) 98.7 F (37.1 C)  TempSrc: Oral     SpO2: 97% 99% 97% 97%  Weight:      Height:        General: Appearance:    Well developed, well nourished male in no acute distress     Lungs:    respirations unlabored     MS:   All extremities are intact.  Neurologic:   Awake, alert       Family Communication: none  Disposition: Status is: Inpatient Remains inpatient appropriate because: cellulitis    Time spent: 35 minutes  Author: Joseph Art, DO 01/11/2024 1:21 PM  For on call review [email or web address].

## 2024-01-11 NOTE — Progress Notes (Signed)
 Pt C/O indigestion, nausea and vomiting. Pt vomited 3 x times despite getting zofran. Pt refused to take maalox saying he vomited yesterday after taking that medication and asked for tums. Dr. Benjamine Mola made aware about it. Tums given as ordered. X-ray abd ordered to rule out obstruction. Pt repositioned to semi folwers position, call bell placed within reach.

## 2024-01-11 NOTE — Plan of Care (Signed)

## 2024-01-11 NOTE — Plan of Care (Signed)
  Problem: Education: Goal: Knowledge of General Education information will improve Description: Including pain rating scale, medication(s)/side effects and non-pharmacologic comfort measures Outcome: Progressing   Problem: Health Behavior/Discharge Planning: Goal: Ability to manage health-related needs will improve Outcome: Progressing   Problem: Clinical Measurements: Goal: Ability to maintain clinical measurements within normal limits will improve Outcome: Progressing Goal: Will remain free from infection Outcome: Progressing Goal: Diagnostic test results will improve Outcome: Progressing Goal: Respiratory complications will improve Outcome: Progressing Goal: Cardiovascular complication will be avoided Outcome: Progressing   Problem: Activity: Goal: Risk for activity intolerance will decrease Outcome: Progressing   Problem: Coping: Goal: Level of anxiety will decrease Outcome: Progressing   Problem: Pain Managment: Goal: General experience of comfort will improve and/or be controlled Outcome: Progressing   Problem: Safety: Goal: Ability to remain free from injury will improve Outcome: Progressing   Problem: Skin Integrity: Goal: Risk for impaired skin integrity will decrease Outcome: Progressing   Problem: Clinical Measurements: Goal: Ability to avoid or minimize complications of infection will improve Outcome: Progressing   Problem: Skin Integrity: Goal: Skin integrity will improve Outcome: Progressing

## 2024-01-12 DIAGNOSIS — L03116 Cellulitis of left lower limb: Secondary | ICD-10-CM | POA: Diagnosis not present

## 2024-01-12 LAB — CREATININE, SERUM
Creatinine, Ser: 0.71 mg/dL (ref 0.61–1.24)
GFR, Estimated: >60 mL/min (ref >60)

## 2024-01-12 NOTE — Progress Notes (Signed)
  Progress Note   Patient: [name] [id number] DOB: [birth year] DOA: 01/05/2024     7 DOS: the patient was seen and examined on 01/12/2024   Brief hospital course: 83 year old man PMH including hepatitis C, cirrhosis, multiple DVTs, PE status post trauma 2 years ago, currently on apixaban, presented with worsening left lower extremity edema, redness and pain.  Admitted for left lower extremity cellulitis and acute DVT on apixaban.  Slowly improving with abx.    Consultants Hematology     Assessment and Plan: LLE cellulitis  -IV abx-- ? A venous insuff component with the redness- no fever, no WBC-- abx stopped after 7 days -Wound care. -Needs to continue elevating extremities   LLE DVT  -On apixaban.  Has a history of DVT and PE.   -Receives all care at [facility]. records in care everywhere.  Dr. Irene Limbo discussed with Dr. Cherly Hensen:  Continue apixaban, this is not considered a treatment failure.  Hyponatremia  -Improved with IVF   Cirrhosis  Hepatitis C -stable   Anxiety  -Continue Klonopin as-needed   Hypokalemia 3/31 -Replete  Nausea and vomiting -added compazine with resolution      Subjective:  Placed several pillows under foot to elevate   Physical Exam: Vitals:   01/11/24 1318 01/11/24 2218 01/12/24 0708 01/12/24 1351  BP: (!) 157/98 (!) 161/62 (!) 153/82 134/77  Pulse:  (!) 58 87 90  Resp: 17 19 18 16   Temp: 98.7 F (37.1 C) 97.8 F (36.6 C) (!) 97.5 F (36.4 C) 98 F (36.7 C)  TempSrc:  Oral  Oral  SpO2: 97% 94% 97% 95%  Weight:      Height:        General: Appearance:    Well developed, well nourished male in no acute distress     Lungs:    respirations unlabored     MS:   All extremities are intact.  Neurologic:   Awake, alert       Family Communication: none   Time spent: 35 minutes  Author: Joseph Art, DO 01/12/2024 2:20 PM  For on call review [email or web address].

## 2024-01-12 NOTE — Progress Notes (Signed)
 Physical Therapy Treatment Patient Details Name: [name] MRN: [id number] DOB: 07/10/1941 Today's Date: 01/12/2024   History of Present Illness Pt is an 61-yo male presented with worsening left lower extremity edema, redness and pain. Admitted for left lower extremity cellulitis and acute DVT on apixaban. PMH including hepatitis C, cirrhosis, multiple DVTs, PE status post trauma 2 years ago, currently on apixaban,    PT Comments  Pt appears more motivated today and ambulated good distance in hallway.  Pt anticipates d/c home likely tomorrow.  Pt requested return to bed end of session so LEs elevated above heart with bed positioning and pillows.     If plan is discharge home, recommend the following: A little help with walking and/or transfers;A little help with bathing/dressing/bathroom;Help with stairs or ramp for entrance   Can travel by private vehicle        Equipment Recommendations  None recommended by PT    Recommendations for Other Services       Precautions / Restrictions Precautions Precautions: Fall Recall of Precautions/Restrictions: Intact Restrictions Weight Bearing Restrictions Per Provider Order: No     Mobility  Bed Mobility Overal bed mobility: Modified Independent                  Transfers Overall transfer level: Needs assistance Equipment used: Rolling walker (2 wheels) Transfers: Sit to/from Stand Sit to Stand: Contact guard assist           General transfer comment: cues for positioning    Ambulation/Gait Ambulation/Gait assistance: Contact guard assist Gait Distance (Feet): 140 Feet Assistive device: Rolling walker (2 wheels) Gait Pattern/deviations: Step-to pattern, Decreased stance time - left, Antalgic       General Gait Details: continues to c/o L LE burning pain but able to progress ambulation distance today, also used RW; once ambulating pt reported "it feels good to be moving and getting my blood  circulating."   Stairs             Wheelchair Mobility     Tilt Bed    Modified Rankin (Stroke Patients Only)       Balance Overall balance assessment: Needs assistance         Standing balance support: Reliant on assistive device for balance, During functional activity Standing balance-Leahy Scale: Poor                              Communication Communication Communication: No apparent difficulties  Cognition Arousal: Alert Behavior During Therapy: WFL for tasks assessed/performed   PT - Cognitive impairments: No apparent impairments                       PT - Cognition Comments: very talkative Following commands: Intact      Cueing Cueing Techniques: Verbal cues  Exercises      General Comments        Pertinent Vitals/Pain Pain Assessment Pain Assessment: Faces Faces Pain Scale: Hurts little more Pain Location: L LE Pain Descriptors / Indicators: Grimacing, Burning Pain Intervention(s): Repositioned, Monitored during session (elevated LEs)    Home Living                          Prior Function            PT Goals (current goals can now be found in the care plan section) Progress towards PT goals: Progressing  toward goals    Frequency    Min 3X/week      PT Plan      Co-evaluation              AM-PAC PT "6 Clicks" Mobility   Outcome Measure  Help needed turning from your back to your side while in a flat bed without using bedrails?: None Help needed moving from lying on your back to sitting on the side of a flat bed without using bedrails?: None Help needed moving to and from a bed to a chair (including a wheelchair)?: None Help needed standing up from a chair using your arms (e.g., wheelchair or bedside chair)?: None Help needed to walk in hospital room?: A Little Help needed climbing 3-5 steps with a railing? : A Little 6 Click Score: 22    End of Session Equipment Utilized During  Treatment: Gait belt Activity Tolerance: Patient tolerated treatment well Patient left: in bed;with bed alarm set;with call bell/phone within reach Nurse Communication: Mobility status PT Visit Diagnosis: Other abnormalities of gait and mobility (R26.89);History of falling (Z91.81)     Time: 1610-9604 PT Time Calculation (min) (ACUTE ONLY): 21 min  Charges:    $Gait Training: 8-22 mins PT General Charges $$ ACUTE PT VISIT: 1 Visit                    Thomasene Mohair PT, DPT Physical Therapist Acute Rehabilitation Services Office: [phone]    [name] 01/12/2024, 1:05 PM

## 2024-01-12 NOTE — Plan of Care (Signed)
  Problem: Education: Goal: Knowledge of General Education information will improve Description: Including pain rating scale, medication(s)/side effects and non-pharmacologic comfort measures Outcome: Progressing   Problem: Health Behavior/Discharge Planning: Goal: Ability to manage health-related needs will improve Outcome: Progressing   Problem: Clinical Measurements: Goal: Ability to maintain clinical measurements within normal limits will improve Outcome: Progressing Goal: Will remain free from infection Outcome: Progressing Goal: Diagnostic test results will improve Outcome: Progressing Goal: Respiratory complications will improve Outcome: Progressing Goal: Cardiovascular complication will be avoided Outcome: Progressing   Problem: Activity: Goal: Risk for activity intolerance will decrease Outcome: Progressing   Problem: Coping: Goal: Level of anxiety will decrease Outcome: Progressing   Problem: Pain Managment: Goal: General experience of comfort will improve and/or be controlled Outcome: Progressing   Problem: Safety: Goal: Ability to remain free from injury will improve Outcome: Progressing   Problem: Skin Integrity: Goal: Risk for impaired skin integrity will decrease Outcome: Progressing   Problem: Clinical Measurements: Goal: Ability to avoid or minimize complications of infection will improve Outcome: Progressing   Problem: Skin Integrity: Goal: Skin integrity will improve Outcome: Progressing

## 2024-01-13 DIAGNOSIS — L03116 Cellulitis of left lower limb: Secondary | ICD-10-CM | POA: Diagnosis not present

## 2024-01-13 LAB — CREATININE, SERUM
Creatinine, Ser: 0.68 mg/dL (ref 0.61–1.24)
GFR, Estimated: >60 mL/min (ref >60)

## 2024-01-13 NOTE — TOC Progression Note (Signed)
 Transition of Care [facility]) - Progression Note    Patient Details  Name: [name] MRN: [id number] Date of Birth: Sep 05, 1941  Transition of [facility]) CM/SW Contact  Amada Jupiter, LCSW Phone Number: 01/13/2024, 1:59 PM  Clinical Narrative:     Met with pt today to discuss dc needs as it appears he is getting close to medical readiness for dc.  Explained that recommendation made for [facility] and PT.  Pt declines any PT but reluctantly agreeable with HHRN.  No HH agency preference.  Referral placed with [facility] for monitoring of LE cellulitis/ wound.  TOC will continue to follow for any additional needs as they may arise.       Expected Discharge Plan and Services                                   HH Arranged: RN [facility] Agency: [facility] Date HH Agency Contacted: 01/13/24 Time HH Agency Contacted: 1359 Representative spoke with at [facility] Agency: Adele Dan   Social Determinants of Health (SDOH) Interventions SDOH Screenings   Food Insecurity: No Food Insecurity (01/06/2024)  Housing: Low Risk  (01/06/2024)  Transportation Needs: No Transportation Needs (01/06/2024)  Utilities: Not At Risk (01/06/2024)  Tobacco Use: Medium Risk (01/05/2024)    Readmission Risk Interventions     No data to display

## 2024-01-13 NOTE — Plan of Care (Signed)

## 2024-01-13 NOTE — Progress Notes (Signed)
 Physical Therapy Treatment Patient Details Name: [name] MRN: [id number] DOB: 1941/04/15 Today's Date: 01/13/2024   History of Present Illness Pt is an 54-yo male presented with worsening left lower extremity edema, redness and pain. Admitted for left lower extremity cellulitis and acute DVT on apixaban. PMH including hepatitis C, cirrhosis, multiple DVTs, PE status post trauma 2 years ago, currently on apixaban,    PT Comments  PT - Cognition Comments: AxO x 3 [employer] plans to return home with "some" help from brother.  C/O nausea today.  "Have not poop in a couple of days".  Also, still c/o L LE pain "burning". Assisted OOB to amb in hallway then to bathroom.  General transfer comment: self able with good use of B UE's to steady self.  Also assisted with a toilet transfer for attempted BM but "nothing".  Pt c/o nausea and feeling "backed up". Then positioned in recliner to comfort.  Pt plans to D/C to home with [facility] and "some" help from his Brother.    If plan is discharge home, recommend the following: A little help with walking and/or transfers;A little help with bathing/dressing/bathroom;Help with stairs or ramp for entrance   Can travel by private vehicle        Equipment Recommendations  None recommended by PT    Recommendations for Other Services       Precautions / Restrictions Precautions Precautions: Fall Restrictions Weight Bearing Restrictions Per Provider Order: No     Mobility  Bed Mobility Overal bed mobility: Modified Independent             General bed mobility comments: Pt self able OOB and back into bed.    Transfers Overall transfer level: Needs assistance Equipment used: Rolling walker (2 wheels) Transfers: Sit to/from Stand Sit to Stand: Supervision           General transfer comment: self able with good use of B UE's to steady self.  Also assisted with a toilet transfer for attempted BM but "nothing".  Pt c/o nausea and feeling  "backed up".    Ambulation/Gait Ambulation/Gait assistance: Contact guard assist, Min assist Gait Distance (Feet): 125 Feet Assistive device: Rolling walker (2 wheels) Gait Pattern/deviations: Step-to pattern, Decreased stance time - left, Antalgic Gait velocity: decreased     General Gait Details: continues to c/o L LE burning pain but able to progress ambulation distance today.  Pt c/o nausea and lower ABD pain.   Stairs             Wheelchair Mobility     Tilt Bed    Modified Rankin (Stroke Patients Only)       Balance                                            Communication Communication Communication: No apparent difficulties  Cognition Arousal: Alert Behavior During Therapy: WFL for tasks assessed/performed   PT - Cognitive impairments: No apparent impairments                       PT - Cognition Comments: AxO x 3 [employer] plans to return home with "some" help from brother.  C/O nausea today.  "Have not poop in a couple of days".  Also, still c/o L LE pain "burning". Following commands: Intact      Cueing Cueing Techniques: Verbal  cues  Exercises      General Comments        Pertinent Vitals/Pain Pain Assessment Pain Assessment: Faces Faces Pain Scale: Hurts a little bit Pain Location: L LE Pain Descriptors / Indicators: Grimacing, Burning Pain Intervention(s): Monitored during session    Home Living                          Prior Function            PT Goals (current goals can now be found in the care plan section) Progress towards PT goals: Progressing toward goals    Frequency    Min 3X/week      PT Plan      Co-evaluation              AM-PAC PT "6 Clicks" Mobility   Outcome Measure  Help needed turning from your back to your side while in a flat bed without using bedrails?: None Help needed moving from lying on your back to sitting on the side of a flat bed without using  bedrails?: None Help needed moving to and from a bed to a chair (including a wheelchair)?: None Help needed standing up from a chair using your arms (e.g., wheelchair or bedside chair)?: None Help needed to walk in hospital room?: None Help needed climbing 3-5 steps with a railing? : A Little 6 Click Score: 23    End of Session Equipment Utilized During Treatment: Gait belt Activity Tolerance: Patient tolerated treatment well Patient left: in chair;with call bell/phone within reach Nurse Communication: Mobility status PT Visit Diagnosis: Other abnormalities of gait and mobility (R26.89);History of falling (Z91.81)     Time: 1610-9604 PT Time Calculation (min) (ACUTE ONLY): 25 min  Charges:    $Gait Training: 8-22 mins $Therapeutic Activity: 8-22 mins PT General Charges $$ ACUTE PT VISIT: 1 Visit                     Felecia Shelling  PTA Acute  Rehabilitation Services Office M-F          [phone]

## 2024-01-13 NOTE — Progress Notes (Signed)
  Progress Note   Patient: [name]:[id number] DOB: August 07, 1941 DOA: 01/05/2024     8 DOS: the patient was seen and examined on 01/13/2024   Brief hospital course: 83 year old man PMH including hepatitis C, cirrhosis, multiple DVTs, PE status post trauma 2 years ago, currently on apixaban, presented with worsening left lower extremity edema, redness and pain.  Admitted for left lower extremity cellulitis and acute DVT on apixaban.  Slowly improving with abx.    Consultants Hematology     Assessment and Plan: LLE cellulitis  -IV abx-- ? A venous insuff component with the redness- no fever, no WBC-- abx stopped after 7 days -Wound care. -Needs to continue elevating extremities   LLE DVT  -On apixaban.  Has a history of DVT and PE.   -Receives all care at [facility]. records in care everywhere.  Dr. Irene Limbo discussed with Dr. Cherly Hensen:  Continue apixaban, this is not considered a treatment failure.  Hyponatremia  -Improved with IVF   Cirrhosis  Hepatitis C -stable   Anxiety  -Continue Klonopin as-needed   Hypokalemia 3/31 -Replete  Nausea and vomiting -added compazine with improvement -bowel regimen    Home health RN ordered   Subjective:  C/o not having a BM but refusing the enema he requested Planning to start ivermectin as an outpatient   Physical Exam: Vitals:   01/12/24 1351 01/12/24 2124 01/13/24 0456 01/13/24 1413  BP: 134/77 134/77 (!) 145/75 135/75  Pulse: 90 90 81 83  Resp: 16 16 18 17   Temp: 98 F (36.7 C) 98.3 F (36.8 C) (!) 97.3 F (36.3 C) 98.2 F (36.8 C)  TempSrc: Oral Oral Oral Oral  SpO2: 95% 96% 96% 97%  Weight:      Height:        General: Appearance:    Well developed, well nourished male in no acute distress     Lungs:    respirations unlabored   Leg wrapped  MS:   All extremities are intact.  Neurologic:   Awake, alert       Family Communication: none Home in the AM    Time spent: 35 minutes  Author: Joseph Art, DO 01/13/2024 2:26 PM  For on call review [email or web address].

## 2024-01-13 NOTE — Progress Notes (Signed)
 Patient refused tap water enema. Patient was educated, patient verbalized understanding.

## 2024-01-14 DIAGNOSIS — L03116 Cellulitis of left lower limb: Secondary | ICD-10-CM | POA: Diagnosis not present

## 2024-01-14 LAB — CREATININE, SERUM
Creatinine, Ser: 0.78 mg/dL (ref 0.61–1.24)
GFR, Estimated: >60 mL/min (ref >60)

## 2024-01-14 MED ORDER — PROCHLORPERAZINE MALEATE 5 MG PO TABS: 5.0000 mg | ORAL_TABLET | Freq: Four times a day (QID) | ORAL | 0 refills | Status: AC | PRN

## 2024-01-14 MED ORDER — POLYETHYLENE GLYCOL 3350 17 G PO PACK: 17.0000 g | PACK | Freq: Every day | ORAL | Status: AC | PRN

## 2024-01-14 MED ORDER — CALCIUM CARBONATE ANTACID 500 MG PO CHEW: 1.0000 | CHEWABLE_TABLET | Freq: Three times a day (TID) | ORAL | Status: AC | PRN

## 2024-01-14 MED ORDER — OXYCODONE HCL 5 MG PO TABS
5.0000 mg | ORAL_TABLET | ORAL | 0 refills | Status: AC | PRN
Start: 2024-01-14 — End: ?

## 2024-01-14 NOTE — Care Management Important Message (Signed)
 Important Message  Patient Details IM Letter was given to the Patient. Name: [name] MRN: [id number] Date of Birth: [birth year]   Important Message Given:  Yes - Medicare IM     Caren Macadam 01/14/2024, 10:19 AM

## 2024-01-14 NOTE — TOC Transition Note (Signed)
 Transition of Care [facility]) - Discharge Note   Patient Details  Name: [name] MRN: [id number] Date of Birth: [birth year]  Transition of Care [facility]) CM/SW Contact:  Amada Jupiter, LCSW Phone Number: 01/14/2024, 11:54 AM   Clinical Narrative:    Pt is medically cleared for dc home today. Pt requesting wc for home use and no DME agency preference.  Order placed with [facility] for delivery to room (to home if pt ready to dc prior to delivery).  HHRN arranged with [facility].  No further TOC needs.   Final next level of care: Home w Home Health Services Barriers to Discharge: Barriers Resolved   Patient Goals and CMS Choice Patient states their goals for this hospitalization and ongoing recovery are:: return home          Discharge Placement                       Discharge Plan and Services Additional resources added to the After Visit Summary for                  DME Arranged: Lightweight manual wheelchair with seat cushion DME Agency: [facility] Date DME Agency Contacted: 01/14/24 Time DME Agency Contacted: 1154 Representative spoke with at DME Agency: Ian Malkin HH Arranged: RN HH Agency: [facility] ([facility] Date HH Agency Contacted: 01/13/24 Time HH Agency Contacted: 1359 Representative spoke with at [facility] Agency: Adele Dan  Social Drivers of Health (SDOH) Interventions SDOH Screenings   Food Insecurity: No Food Insecurity (01/06/2024)  Housing: Low Risk  (01/06/2024)  Transportation Needs: No Transportation Needs (01/06/2024)  Utilities: Not At Risk (01/06/2024)  Tobacco Use: Medium Risk (01/05/2024)     Readmission Risk Interventions     No data to display

## 2024-01-14 NOTE — Discharge Summary (Signed)
 Physician Discharge Summary  [name]:[id number] DOB: 04-Oct-1941 DOA: 01/05/2024  PCP: Darrow Bussing, MD  Admit date: 01/05/2024 Discharge date: 01/14/2024  Admitted From: home Discharge disposition: home   Recommendations for Outpatient Follow-Up:   Home health RN Wound check with PCP 1 week Elevate extremity    Discharge Diagnosis:   Principal Problem:   Cellulitis of left lower extremity Active Problems:   Deep vein thrombosis (DVT) of left lower extremity (HCC)   Hepatic cirrhosis (HCC)   Anxiety   Chronic anticoagulation    Discharge Condition: Improved.  Diet recommendation: Low sodium, heart healthy.  Wound care: None.  Code status: Full.   History of Present Illness:    [name] is a 83 y.o. male with medical history significant for hepatitis C, cirrhosis, TIA, anxiety, left leg DVT on Eliquis who presents with worsening left, swelling, and redness.   Patient reports history of left lower extremity DVT in December 2023 in the setting of major trauma.  He has been on Eliquis and reports missing only 3 doses since he began taking it.  He reports developing a small wound on the anterior aspect of his lower left leg 2 weeks ago when he bumped into a piece of furniture.  Over the past several days, he has had worsening redness, pain, and swelling surrounding the wound, and now is having some purulent drainage as well.  Pain is severe and he is unable to bear weight on the leg.  He denies chest pain, shortness of breath, or hemoptysis.   Hospital Course by Problem:   LLE cellulitis  -IV abx for 7 days-- improved on day of d/c ? A venous insuff component with the redness- no fever, no WBC -Wound care. -Needs to continue elevating extremities and wearing TED hose   LLE DVT  -On apixaban.  Has a history of DVT and PE.   -Receives all care at [facility]. records in care everywhere.  Dr. Irene Limbo discussed with Dr. Cherly Hensen:  Continue apixaban, this is  not considered a treatment failure.   Hyponatremia  -Improved with IVF   Cirrhosis  Hepatitis C -stable   Anxiety  -Continue Klonopin as-needed    Hypokalemia  -Repleted   Nausea and vomiting -added compazine with improvement -bowel regimen          Medical Consultants:      Discharge Exam:   Vitals:   01/13/24 2204 01/14/24 0527  BP: (!) 143/75 136/66  Pulse: 79 73  Resp: 18 18  Temp: 98 F (36.7 C) 97.7 F (36.5 C)  SpO2: 98% 96%   Vitals:   01/13/24 0456 01/13/24 1413 01/13/24 2204 01/14/24 0527  BP: (!) 145/75 135/75 (!) 143/75 136/66  Pulse: 81 83 79 73  Resp: 18 17 18 18   Temp: (!) 97.3 F (36.3 C) 98.2 F (36.8 C) 98 F (36.7 C) 97.7 F (36.5 C)  TempSrc: Oral Oral Oral   SpO2: 96% 97% 98% 96%  Weight:      Height:        General exam: Appears calm and comfortable.    The results of significant diagnostics from this hospitalization (including imaging, microbiology, ancillary and laboratory) are listed below for reference.     Procedures and Diagnostic Studies:   DG Chest Port 1 View Result Date: 01/05/2024 CLINICAL DATA:  Left leg wound. EXAM: PORTABLE CHEST 1 VIEW COMPARISON:  September 13, 2022. FINDINGS: The heart size and mediastinal contours are within normal  limits. Minimal bibasilar subsegmental atelectasis or scarring is noted. The visualized skeletal structures are unremarkable. IMPRESSION: No active disease. Electronically Signed   By: Lupita Raider M.D.   On: 01/05/2024 18:30   DG Tibia/Fibula Left Result Date: 01/05/2024 CLINICAL DATA:  Left lower extremity wound for 1 week. EXAM: LEFT TIBIA AND FIBULA - 2 VIEW COMPARISON:  None Available. FINDINGS: Status post left total knee arthroplasty. No fracture or dislocation is noted. Vascular calcifications are noted. No lytic destruction is noted. IMPRESSION: No acute abnormality seen. Electronically Signed   By: Lupita Raider M.D.   On: 01/05/2024 18:29   US Venous Img Lower  Unilateral Left (DVT) Result Date: 01/05/2024 CLINICAL DATA:  Nonhealing wound left calf.  Swelling. EXAM: LEFT LOWER EXTREMITY VENOUS DOPPLER ULTRASOUND TECHNIQUE: Gray-scale sonography with graded compression, as well as color Doppler and duplex ultrasound were performed to evaluate the lower extremity deep venous systems from the level of the common femoral vein and including the common femoral, femoral, profunda femoral, popliteal and calf veins including the posterior tibial, peroneal and gastrocnemius veins when visible. The superficial great saphenous vein was also interrogated. Spectral Doppler was utilized to evaluate flow at rest and with distal augmentation maneuvers in the common femoral, femoral and popliteal veins. COMPARISON:  None Available. FINDINGS: Contralateral Common Femoral Vein: No evidence of thrombus. Normal compressibility. Common Femoral Vein: Nonocclusive clot noted with partial compressibility. Saphenofemoral Junction: No evidence of thrombus. Normal compressibility and flow on color Doppler imaging. Profunda Femoral Vein: No evidence of thrombus. Normal compressibility and flow on color Doppler imaging. Femoral Vein: Nonocclusive clot with partial compressibility. Popliteal Vein: Nonocclusive clot with partial compressibility. Calf Veins: Limited visualization.  No evidence of thrombus. Superficial Great Saphenous Vein: No evidence of thrombus. Normal compressibility. Venous Reflux:  Not assessed Other Findings:  None. IMPRESSION: Partially occlusive left lower extremity DVT involving the left common femoral vein, femoral vein and popliteal vein. Electronically Signed   By: Charlett Nose M.D.   On: 01/05/2024 18:17     Labs:   Basic Metabolic Panel: Recent Labs  Lab 01/08/24 0331 01/10/24 0321 01/11/24 0321 01/12/24 0326 01/13/24 0318 01/14/24 0333  NA 131* 134*  --   --   --   --   K 3.2* 3.6  --   --   --   --   CL 94* 103  --   --   --   --   CO2 28 22  --   --   --    --   GLUCOSE 213* 99  --   --   --   --   BUN 13 12  --   --   --   --   CREATININE 0.71 0.83 0.86 0.71 0.68 0.78  CALCIUM 8.4* 8.3*  --   --   --   --    GFR Estimated Creatinine Clearance: 78.1 mL/min (by C-G formula based on SCr of 0.78 mg/dL). Liver Function Tests: No results for input(s): "AST", "ALT", "ALKPHOS", "BILITOT", "PROT", "ALBUMIN" in the last 168 hours. No results for input(s): "LIPASE", "AMYLASE" in the last 168 hours. No results for input(s): "AMMONIA" in the last 168 hours. Coagulation profile No results for input(s): "INR", "PROTIME" in the last 168 hours.  CBC: Recent Labs  Lab 01/10/24 0321  WBC 5.4  HGB 11.3*  HCT 33.5*  MCV 94.1  PLT 245   Cardiac Enzymes: No results for input(s): "CKTOTAL", "CKMB", "CKMBINDEX", "TROPONINI" in the last  168 hours. BNP: Invalid input(s): "POCBNP" CBG: No results for input(s): "GLUCAP" in the last 168 hours. D-Dimer No results for input(s): "DDIMER" in the last 72 hours. Hgb A1c No results for input(s): "HGBA1C" in the last 72 hours. Lipid Profile No results for input(s): "CHOL", "HDL", "LDLCALC", "TRIG", "CHOLHDL", "LDLDIRECT" in the last 72 hours. Thyroid function studies No results for input(s): "TSH", "T4TOTAL", "T3FREE", "THYROIDAB" in the last 72 hours.  Invalid input(s): "FREET3" Anemia work up No results for input(s): "VITAMINB12", "FOLATE", "FERRITIN", "TIBC", "IRON", "RETICCTPCT" in the last 72 hours. Microbiology Recent Results (from the past 240 hours)  Blood Culture (routine x 2)     Status: None   Collection Time: 01/05/24  5:04 PM   Specimen: BLOOD  Result Value Ref Range Status   Specimen Description   Final    BLOOD RIGHT ANTECUBITAL Performed at [facility], [address] [place], [place] [address]    Special Requests   Final    BOTTLES DRAWN AEROBIC AND ANAEROBIC Blood Culture adequate volume Performed at [facility], [address]  [place], [place] [address]    Culture   Final    NO GROWTH 5 DAYS Performed at [facility], [address], [place], [place] [address]    Report Status 01/10/2024 FINAL  Final  Resp panel by RT-PCR (RSV, Flu A&B, Covid) Anterior Nasal Swab     Status: None   Collection Time: 01/05/24  5:24 PM   Specimen: Anterior Nasal Swab  Result Value Ref Range Status   SARS Coronavirus 2 by RT PCR NEGATIVE NEGATIVE Final    Comment: (NOTE) SARS-CoV-2 target nucleic acids are NOT DETECTED.  The SARS-CoV-2 RNA is generally detectable in upper respiratory specimens during the acute phase of infection. The lowest concentration of SARS-CoV-2 viral copies this assay can detect is 138 copies/mL. A negative result does not preclude SARS-Cov-2 infection and should not be used as the sole basis for treatment or other patient management decisions. A negative result may occur with  improper specimen collection/handling, submission of specimen other than nasopharyngeal swab, presence of viral mutation(s) within the areas targeted by this assay, and inadequate number of viral copies(<138 copies/mL). A negative result must be combined with clinical observations, patient history, and epidemiological information. The expected result is Negative.  Fact Sheet for Patients:  BloggerCourse.com  Fact Sheet for Healthcare Providers:  SeriousBroker.it  This test is no t yet approved or cleared by the [place] FDA and  has been authorized for detection and/or diagnosis of SARS-CoV-2 by FDA under an Emergency Use Authorization (EUA). This EUA will remain  in effect (meaning this test can be used) for the duration of the COVID-19 declaration under Section 564(b)(1) of the Act, 21 U.S.C.section 360bbb-3(b)(1), unless the authorization is terminated  or revoked sooner.       Influenza A by PCR NEGATIVE NEGATIVE Final   Influenza B by PCR NEGATIVE NEGATIVE  Final    Comment: (NOTE) The Xpert Xpress SARS-CoV-2/FLU/RSV plus assay is intended as an aid in the diagnosis of influenza from Nasopharyngeal swab specimens and should not be used as a sole basis for treatment. Nasal washings and aspirates are unacceptable for Xpert Xpress SARS-CoV-2/FLU/RSV testing.  Fact Sheet for Patients: BloggerCourse.com  Fact Sheet for Healthcare Providers: SeriousBroker.it  This test is not yet approved or cleared by the [place] FDA and has been authorized for detection and/or diagnosis of SARS-CoV-2 by FDA under an Emergency Use Authorization (EUA). This  EUA will remain in effect (meaning this test can be used) for the duration of the COVID-19 declaration under Section 564(b)(1) of the Act, 21 U.S.C. section 360bbb-3(b)(1), unless the authorization is terminated or revoked.     Resp Syncytial Virus by PCR NEGATIVE NEGATIVE Final    Comment: (NOTE) Fact Sheet for Patients: BloggerCourse.com  Fact Sheet for Healthcare Providers: SeriousBroker.it  This test is not yet approved or cleared by the [place] FDA and has been authorized for detection and/or diagnosis of SARS-CoV-2 by FDA under an Emergency Use Authorization (EUA). This EUA will remain in effect (meaning this test can be used) for the duration of the COVID-19 declaration under Section 564(b)(1) of the Act, 21 U.S.C. section 360bbb-3(b)(1), unless the authorization is terminated or revoked.  Performed at [facility], [address] [place], [place] [address]   Blood Culture (routine x 2)     Status: None   Collection Time: 01/05/24  5:44 PM   Specimen: BLOOD  Result Value Ref Range Status   Specimen Description   Final    BLOOD BLOOD LEFT FOREARM Performed at [facility], [address], [place], [place] [address]    Special Requests    Final    BOTTLES DRAWN AEROBIC AND ANAEROBIC Blood Culture adequate volume Performed at [facility], [address] [place], [place] [address]    Culture   Final    NO GROWTH 5 DAYS Performed at [facility], [address] [place], [place] [address]    Report Status 01/10/2024 FINAL  Final     Discharge Instructions:   Discharge Instructions     Diet general   Complete by: As directed    Discharge wound care:   Complete by: As directed    Apply Xeroform gauze to left leg wound Q day, then cover with ABD pad and kerlex-- daily if able if not every other day Would keep leg elevated and when not would use compression hose   Increase activity slowly   Complete by: As directed       Allergies as of 01/14/2024       Reactions   Iodine Anaphylaxis   Iohexol Anaphylaxis        Medication List     STOP taking these medications    Latanoprostene Bunod 0.024 % Soln       TAKE these medications    ACIDOPHILUS LACTOBACILLUS PO Take 2 tablets by mouth daily.   apixaban 5 MG Tabs tablet Commonly known as: ELIQUIS Take 5 mg by mouth 2 (two) times daily.   aspirin 81 MG tablet Take 81 mg by mouth at bedtime.   calcium carbonate 500 MG chewable tablet Commonly known as: TUMS - dosed in mg elemental calcium Chew 1 tablet (200 mg of elemental calcium total) by mouth 3 (three) times daily as needed for indigestion or heartburn.   Cholecalciferol 25 MCG (1000 UT) Tbdp Take 1,000 Units by mouth daily.   clonazePAM 1 MG tablet Commonly known as: KLONOPIN Take 1 mg by mouth at bedtime.   COENZYME Q-10 PO Take 1 capsule by mouth daily.   docusate sodium 100 MG capsule Commonly known as: COLACE Take 200 mg by mouth at bedtime.   Fish Oil 1000 MG Caps Take 1,000 mg by mouth at bedtime.   fluorouracil 5 % cream Commonly known as: EFUDEX Apply 1 Application topically 2 (two) times daily. X14 days   hydrocortisone 1 % lotion Apply 1  Application topically 3 (three) times daily as needed for itching (skin rash).   multivitamin tablet Take 1 tablet by mouth daily.   Netarsudil-Latanoprost 0.02-0.005 % Soln Place 1 drop into both eyes at bedtime.   omeprazole 20 MG capsule Commonly known as: PRILOSEC Take 20 mg by mouth daily.   oxyCODONE 5 MG immediate release tablet Commonly known as: Oxy IR/ROXICODONE Take 1 tablet (5 mg total) by mouth every 4 (four) hours as needed for moderate pain (pain score 4-6).   polyethylene glycol 17 g packet Commonly known as: MIRALAX / GLYCOLAX Take 17 g by mouth daily as needed for mild constipation.   prochlorperazine 5 MG tablet Commonly known as: COMPAZINE Take 1 tablet (5 mg total) by mouth every 6 (six) hours as needed for nausea or vomiting.   sildenafil 100 MG tablet Commonly known as: VIAGRA Take 100 mg by mouth as needed for erectile dysfunction.   tamsulosin 0.4 MG Caps capsule Commonly known as: FLOMAX Take 0.4 mg by mouth at bedtime.               Discharge Care Instructions  (From admission, onward)           Start     Ordered   01/14/24 0000  Discharge wound care:       Comments: Apply Xeroform gauze to left leg wound Q day, then cover with ABD pad and kerlex-- daily if able if not every other day Would keep leg elevated and when not would use compression hose   01/14/24 0908              Time coordinating discharge: 45 min  Signed:  Joseph Art DO  [facility] 01/14/2024, 9:11 AM

## 2024-01-14 NOTE — Progress Notes (Signed)
    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  A rolling walker  will not resolve issue with performing activities of daily living.  A wheelchair will allow patient to safely perform daily activities.  Patient is not able to propel themselves in the home using a standard weight wheelchair due to upper extremity weakness .  Patient can self propel in the lightweight wheelchair.

## 2024-01-15 DIAGNOSIS — F322 Major depressive disorder, single episode, severe without psychotic features: Secondary | ICD-10-CM | POA: Diagnosis not present

## 2024-01-15 DIAGNOSIS — E871 Hypo-osmolality and hyponatremia: Secondary | ICD-10-CM | POA: Diagnosis not present

## 2024-01-15 DIAGNOSIS — F419 Anxiety disorder, unspecified: Secondary | ICD-10-CM | POA: Diagnosis not present

## 2024-01-15 DIAGNOSIS — I82402 Acute embolism and thrombosis of unspecified deep veins of left lower extremity: Secondary | ICD-10-CM | POA: Diagnosis not present

## 2024-01-15 DIAGNOSIS — E876 Hypokalemia: Secondary | ICD-10-CM | POA: Diagnosis not present

## 2024-01-15 DIAGNOSIS — B1920 Unspecified viral hepatitis C without hepatic coma: Secondary | ICD-10-CM | POA: Diagnosis not present

## 2024-01-15 DIAGNOSIS — F320 Major depressive disorder, single episode, mild: Secondary | ICD-10-CM | POA: Diagnosis not present

## 2024-01-15 DIAGNOSIS — L03116 Cellulitis of left lower limb: Secondary | ICD-10-CM | POA: Diagnosis not present

## 2024-01-15 DIAGNOSIS — I2510 Atherosclerotic heart disease of native coronary artery without angina pectoris: Secondary | ICD-10-CM | POA: Diagnosis not present

## 2024-01-15 DIAGNOSIS — K7460 Unspecified cirrhosis of liver: Secondary | ICD-10-CM | POA: Diagnosis not present

## 2024-07-11 ENCOUNTER — Emergency Department (HOSPITAL_BASED_OUTPATIENT_CLINIC_OR_DEPARTMENT_OTHER)
Admission: EM | Admit: 2024-07-11 | Discharge: 2024-07-11 | Disposition: A | Discharge status: Home / Self Care | Attending: Emergency Medicine | Admitting: Emergency Medicine

## 2024-07-11 ENCOUNTER — Other Ambulatory Visit: Payer: Self-pay

## 2024-07-11 ENCOUNTER — Encounter (HOSPITAL_BASED_OUTPATIENT_CLINIC_OR_DEPARTMENT_OTHER): Payer: Self-pay

## 2024-07-11 DIAGNOSIS — R21 Rash and other nonspecific skin eruption: Secondary | ICD-10-CM | POA: Diagnosis present

## 2024-07-11 DIAGNOSIS — I2510 Atherosclerotic heart disease of native coronary artery without angina pectoris: Secondary | ICD-10-CM | POA: Diagnosis not present

## 2024-07-11 DIAGNOSIS — Z7982 Long term (current) use of aspirin: Secondary | ICD-10-CM | POA: Insufficient documentation

## 2024-07-11 DIAGNOSIS — L509 Urticaria, unspecified: Secondary | ICD-10-CM | POA: Insufficient documentation

## 2024-07-11 DIAGNOSIS — Z7901 Long term (current) use of anticoagulants: Secondary | ICD-10-CM | POA: Insufficient documentation

## 2024-07-11 MED ORDER — PREDNISONE 20 MG PO TABS
40.0000 mg | ORAL_TABLET | Freq: Once | ORAL | Status: AC
  Administered 2024-07-11: 40 mg via ORAL
  Filled 2024-07-11: qty 2

## 2024-07-11 MED ORDER — LORATADINE 10 MG PO TABS
10.0000 mg | ORAL_TABLET | Freq: Every day | ORAL | Status: DC
  Administered 2024-07-11: 10 mg via ORAL
  Filled 2024-07-11: qty 1

## 2024-07-11 MED ORDER — PREDNISONE 20 MG PO TABS: 40.0000 mg | ORAL_TABLET | Freq: Every day | ORAL | 0 refills | Status: AC

## 2024-07-11 MED ORDER — CETIRIZINE HCL 10 MG PO TABS: 10.0000 mg | ORAL_TABLET | Freq: Every day | ORAL | 1 refills | Status: AC

## 2024-07-11 NOTE — ED Provider Notes (Signed)
 [place] EMERGENCY DEPARTMENT AT [facility] Provider Note   CSN: [id number] Arrival date & time: 07/11/24  1235     Patient presents with: Insect Bite and Pruritis   [name] is a 83 y.o. male.   Patient is an 83 year old male with a history of CAD, stroke, prior DVT who is presenting today with complaints of 4 days of a generalized itchy rash.  He reports that for started on his inner thighs and now has moved to affect his whole body.  He reports there is areas that pop up and then disappearing pop up somewhere else.  He reports when they first pop up they will burn but overall are very itchy.  He denies any new products or contact with the skin.  He thought he may have been bit by something but does not recall any particular insect that bit him.  He has not had any change in medications.  No difficulty breathing.  No oral involvement.  The history is provided by the patient and medical records.       Prior to Admission medications   Medication Sig Start Date End Date Taking? Authorizing Provider  cetirizine (ZYRTEC ALLERGY) 10 MG tablet Take 1 tablet (10 mg total) by mouth daily. 07/11/24  Yes Erza Mothershead, Benton, MD  predniSONE (DELTASONE) 20 MG tablet Take 2 tablets (40 mg total) by mouth daily. 07/11/24  Yes Waylyn Tenbrink, [place], MD  ACIDOPHILUS LACTOBACILLUS PO Take 2 tablets by mouth daily.    [provider]  apixaban  (ELIQUIS ) 5 MG TABS tablet Take 5 mg by mouth 2 (two) times daily.    [provider]  aspirin  81 MG tablet Take 81 mg by mouth at bedtime.    [provider]  calcium  carbonate (TUMS - DOSED IN MG ELEMENTAL CALCIUM ) 500 MG chewable tablet Chew 1 tablet (200 mg of elemental calcium  total) by mouth 3 (three) times daily as needed for indigestion or heartburn. 01/14/24   Vann, Jessica U, DO  Cholecalciferol 25 MCG (1000 UT) TBDP Take 1,000 Units by mouth daily.    [provider]  clonazePAM  (KLONOPIN ) 1 MG tablet Take 1  mg by mouth at bedtime.    [provider]  COENZYME Q-10 PO Take 1 capsule by mouth daily.      [provider]  docusate sodium  (COLACE) 100 MG capsule Take 200 mg by mouth at bedtime.    [provider]  fluorouracil (EFUDEX) 5 % cream Apply 1 Application topically 2 (two) times daily. X14 days 12/24/23   [provider]  hydrocortisone 1 % lotion Apply 1 Application topically 3 (three) times daily as needed for itching (skin rash).    [provider]  Multiple Vitamin (MULTIVITAMIN) tablet Take 1 tablet by mouth daily.    [provider]  Netarsudil -Latanoprost  0.02-0.005 % SOLN Place 1 drop into both eyes at bedtime.    [provider]  Omega-3 Fatty Acids (FISH OIL) 1000 MG CAPS Take 1,000 mg by mouth at bedtime.    [provider]  omeprazole (PRILOSEC) 20 MG capsule Take 20 mg by mouth daily.    [provider]  oxyCODONE  (OXY IR/ROXICODONE ) 5 MG immediate release tablet Take 1 tablet (5 mg total) by mouth every 4 (four) hours as needed for moderate pain (pain score 4-6). 01/14/24   Vann, Jessica U, DO  polyethylene glycol (MIRALAX  / GLYCOLAX ) 17 g packet Take 17 g by mouth daily as needed for mild constipation. 01/14/24  Vann, Jessica U, DO  prochlorperazine  (COMPAZINE ) 5 MG tablet Take 1 tablet (5 mg total) by mouth every 6 (six) hours as needed for nausea or vomiting. 01/14/24   Vann, Jessica U, DO  sildenafil (VIAGRA) 100 MG tablet Take 100 mg by mouth as needed for erectile dysfunction.    [provider]  tamsulosin  (FLOMAX ) 0.4 MG CAPS capsule Take 0.4 mg by mouth at bedtime.    [provider]    Allergies: Iodine, Iohexol, and Penicillins    Review of Systems  Updated Vital Signs BP 129/77 (BP Location: Right Arm)   Pulse (!) 102   Temp (!) 97.5 F (36.4 C) (Oral)   Resp 20   Ht 6' (1.829 m)   Wt 83.4 kg   SpO2 99%   BMI 24.94 kg/m   Physical Exam Vitals and nursing note  reviewed.  Constitutional:      General: He is not in acute distress.    Appearance: He is well-developed.  HENT:     Head: Normocephalic and atraumatic.  Eyes:     Conjunctiva/sclera: Conjunctivae normal.     Pupils: Pupils are equal, round, and reactive to light.  Cardiovascular:     Rate and Rhythm: Normal rate and regular rhythm.     Heart sounds: No murmur heard. Pulmonary:     Effort: Pulmonary effort is normal. No respiratory distress.     Breath sounds: Normal breath sounds. No wheezing or rales.  Abdominal:     General: There is no distension.     Palpations: Abdomen is soft.     Tenderness: There is no abdominal tenderness. There is no guarding or rebound.  Musculoskeletal:        General: No tenderness. Normal range of motion.     Cervical back: Normal range of motion and neck supple.     Right lower leg: No edema.     Left lower leg: No edema.  Skin:    General: Skin is warm and dry.     Findings: Rash present. No erythema. Rash is urticarial.     Comments: Urticarial rash involving all 4 extremities most localized to the lower extremities with blanching and no evidence of purpura.  Small area also noted on the abdomen but no facial or neck involvement  Neurological:     Mental Status: He is alert and oriented to person, place, and time.  Psychiatric:        Behavior: Behavior normal.     (all labs ordered are listed, but only abnormal results are displayed) Labs Reviewed - No data to display  EKG: None  Radiology: No results found.   Procedures   Medications Ordered in the ED  predniSONE (DELTASONE) tablet 40 mg (has no administration in time range)  loratadine (CLARITIN) tablet 10 mg (has no administration in time range)                                    Medical Decision Making Risk OTC drugs. Prescription drug management.   Patient presenting today with urticaria but otherwise well-appearing.  Unclear cause without prior history.  No  evidence of erythema multiforme or SJS.  No oral involvement.  No skin sloughing or bulla.  Will give steroids and Zyrtec.  Encouraged follow-up with PCP if symptoms do not resolve.     Final diagnoses:  Urticaria    ED Discharge Orders  Ordered    predniSONE (DELTASONE) 20 MG tablet  Daily        07/11/24 1312    cetirizine (ZYRTEC ALLERGY) 10 MG tablet  Daily        07/11/24 1312               Doretha Folks, MD 07/11/24 1315

## 2024-07-11 NOTE — Discharge Instructions (Addendum)
 You will take your next dose of steroids and the Zyrtec tomorrow

## 2024-07-11 NOTE — ED Triage Notes (Signed)
 Arrives ambulatory to the ED with complaints of worsening insect bite sites to his groin, lower abdomen, and back x1 day.

## 2024-09-17 ENCOUNTER — Emergency Department (HOSPITAL_BASED_OUTPATIENT_CLINIC_OR_DEPARTMENT_OTHER)
Admission: EM | Admit: 2024-09-17 | Discharge: 2024-09-17 | Disposition: A | Discharge status: Home / Self Care | Attending: Emergency Medicine | Admitting: Emergency Medicine

## 2024-09-17 ENCOUNTER — Emergency Department (HOSPITAL_BASED_OUTPATIENT_CLINIC_OR_DEPARTMENT_OTHER): Admitting: Radiology

## 2024-09-17 ENCOUNTER — Other Ambulatory Visit: Payer: Self-pay

## 2024-09-17 ENCOUNTER — Encounter (HOSPITAL_BASED_OUTPATIENT_CLINIC_OR_DEPARTMENT_OTHER): Payer: Self-pay | Admitting: Emergency Medicine

## 2024-09-17 DIAGNOSIS — Z79899 Other long term (current) drug therapy: Secondary | ICD-10-CM | POA: Insufficient documentation

## 2024-09-17 DIAGNOSIS — I2510 Atherosclerotic heart disease of native coronary artery without angina pectoris: Secondary | ICD-10-CM | POA: Insufficient documentation

## 2024-09-17 DIAGNOSIS — R9431 Abnormal electrocardiogram [ECG] [EKG]: Secondary | ICD-10-CM | POA: Diagnosis present

## 2024-09-17 DIAGNOSIS — Z7982 Long term (current) use of aspirin: Secondary | ICD-10-CM | POA: Diagnosis not present

## 2024-09-17 DIAGNOSIS — Z7901 Long term (current) use of anticoagulants: Secondary | ICD-10-CM | POA: Diagnosis not present

## 2024-09-17 LAB — BASIC METABOLIC PANEL WITH GFR
Anion gap: 11 (ref 5–15)
BUN: 20 mg/dL (ref 8–23)
CO2: 23 mmol/L (ref 22–32)
Calcium: 10.4 mg/dL — ABNORMAL HIGH (ref 8.9–10.3)
Chloride: 101 mmol/L (ref 98–111)
Creatinine, Ser: 1.26 mg/dL — ABNORMAL HIGH (ref 0.61–1.24)
GFR, Estimated: 57 mL/min — ABNORMAL LOW (ref >60)
Glucose, Bld: 99 mg/dL (ref 70–99)
Potassium: 5 mmol/L (ref 3.5–5.1)
Sodium: 135 mmol/L (ref 135–145)

## 2024-09-17 LAB — CBC
HCT: 39.9 % (ref 39.0–52.0)
Hemoglobin: 13.5 g/dL (ref 13.0–17.0)
MCH: 29.2 pg (ref 26.0–34.0)
MCHC: 33.8 g/dL (ref 30.0–36.0)
MCV: 86.2 fL (ref 80.0–100.0)
Platelets: 221 K/uL (ref 150–400)
RBC: 4.63 MIL/uL (ref 4.22–5.81)
RDW: 14.6 % (ref 11.5–15.5)
WBC: 8.3 K/uL (ref 4.0–10.5)
nRBC: 0 % (ref 0.0–0.2)

## 2024-09-17 LAB — TROPONIN T, HIGH SENSITIVITY: Troponin T High Sensitivity: 16 ng/L (ref 0–19)

## 2024-09-17 NOTE — ED Notes (Signed)
 ED Provider at bedside.

## 2024-09-17 NOTE — ED Triage Notes (Signed)
 Seen at yesterday with abnormal EKG Denies any symptom at this time

## 2024-09-17 NOTE — ED Provider Notes (Signed)
 [place] EMERGENCY DEPARTMENT AT [facility] Provider Note   CSN: [id number] Arrival date & time: 09/17/24  1151     Patient presents with: Palpitations   [name] is a 83 y.o. male.   Pt is a 83 yo male with pmhx significant for hepatitis C, PTSD, CAD, and hx DVT (no thinners).  Pt went to his doctor yesterday to get something for the DMV.  He had no chest pain, but an EKG was done.  EKG was abnormal, so they told him to come to the ED.  He feels fine and is only here because they told him to come.         Prior to Admission medications  Medication Sig Start Date End Date Taking? Authorizing Provider  ACIDOPHILUS LACTOBACILLUS PO Take 2 tablets by mouth daily.    [provider]  apixaban  (ELIQUIS ) 5 MG TABS tablet Take 5 mg by mouth 2 (two) times daily.    [provider]  aspirin  81 MG tablet Take 81 mg by mouth at bedtime.    [provider]  calcium  carbonate (TUMS - DOSED IN MG ELEMENTAL CALCIUM ) 500 MG chewable tablet Chew 1 tablet (200 mg of elemental calcium  total) by mouth 3 (three) times daily as needed for indigestion or heartburn. 01/14/24   Vann, Jessica U, DO  cetirizine  (ZYRTEC  ALLERGY) 10 MG tablet Take 1 tablet (10 mg total) by mouth daily. 07/11/24   Doretha Folks, MD  Cholecalciferol 25 MCG (1000 UT) TBDP Take 1,000 Units by mouth daily.    [provider]  clonazePAM  (KLONOPIN ) 1 MG tablet Take 1 mg by mouth at bedtime.    [provider]  COENZYME Q-10 PO Take 1 capsule by mouth daily.      [provider]  docusate sodium  (COLACE) 100 MG capsule Take 200 mg by mouth at bedtime.    [provider]  fluorouracil (EFUDEX) 5 % cream Apply 1 Application topically 2 (two) times daily. X14 days 12/24/23   [provider]  hydrocortisone 1 % lotion Apply 1 Application topically 3 (three) times daily as needed for itching (skin rash).    [provider]  Multiple Vitamin  (MULTIVITAMIN) tablet Take 1 tablet by mouth daily.    [provider]  Netarsudil -Latanoprost  0.02-0.005 % SOLN Place 1 drop into both eyes at bedtime.    [provider]  Omega-3 Fatty Acids (FISH OIL) 1000 MG CAPS Take 1,000 mg by mouth at bedtime.    [provider]  omeprazole (PRILOSEC) 20 MG capsule Take 20 mg by mouth daily.    [provider]  oxyCODONE  (OXY IR/ROXICODONE ) 5 MG immediate release tablet Take 1 tablet (5 mg total) by mouth every 4 (four) hours as needed for moderate pain (pain score 4-6). 01/14/24   Vann, Jessica U, DO  polyethylene glycol (MIRALAX  / GLYCOLAX ) 17 g packet Take 17 g by mouth daily as needed for mild constipation. 01/14/24   Vann, Jessica U, DO  predniSONE  (DELTASONE ) 20 MG tablet Take 2 tablets (40 mg total) by mouth daily. 07/11/24   Doretha Folks, MD  prochlorperazine  (COMPAZINE ) 5 MG tablet Take 1 tablet (5 mg total) by mouth every 6 (six) hours as needed for nausea or vomiting. 01/14/24   Vann, Jessica U, DO  sildenafil (VIAGRA) 100 MG tablet Take 100 mg by mouth as needed for erectile dysfunction.    [provider]  tamsulosin  (FLOMAX ) 0.4 MG CAPS capsule Take 0.4 mg by  mouth at bedtime.    [provider]    Allergies: Iodine, Iohexol, and Penicillins    Review of Systems  All other systems reviewed and are negative.   Updated Vital Signs BP 138/79 (BP Location: Right Arm)   Pulse 87   Temp 97.6 F (36.4 C) (Oral)   Resp 16   SpO2 100%   Physical Exam Vitals and nursing note reviewed.  Constitutional:      Appearance: Normal appearance.  HENT:     Head: Normocephalic and atraumatic.     Right Ear: External ear normal.     Left Ear: External ear normal.     Nose: Nose normal.     Mouth/Throat:     Mouth: Mucous membranes are moist.     Pharynx: Oropharynx is clear.  Eyes:     Extraocular Movements: Extraocular movements intact.     Conjunctiva/sclera: Conjunctivae normal.      Pupils: Pupils are equal, round, and reactive to light.  Cardiovascular:     Rate and Rhythm: Normal rate and regular rhythm.     Pulses: Normal pulses.     Heart sounds: Normal heart sounds.  Pulmonary:     Effort: Pulmonary effort is normal.     Breath sounds: Normal breath sounds.  Abdominal:     General: Abdomen is flat. Bowel sounds are normal.     Palpations: Abdomen is soft.  Musculoskeletal:        General: Normal range of motion.     Cervical back: Normal range of motion and neck supple.  Skin:    General: Skin is warm.     Capillary Refill: Capillary refill takes less than 2 seconds.  Neurological:     General: No focal deficit present.     Mental Status: He is alert and oriented to person, place, and time.  Psychiatric:        Mood and Affect: Mood normal.        Behavior: Behavior normal.        Thought Content: Thought content normal.        Judgment: Judgment normal.     (all labs ordered are listed, but only abnormal results are displayed) Labs Reviewed  BASIC METABOLIC PANEL WITH GFR - Abnormal; Notable for the following components:      Result Value   Creatinine, Ser 1.26 (*)    Calcium  10.4 (*)    GFR, Estimated 57 (*)    All other components within normal limits  CBC  TROPONIN T, HIGH SENSITIVITY  TROPONIN T, HIGH SENSITIVITY    EKG: EKG Interpretation Date/Time:  Thursday September 17 2024 12:07:39 EST Ventricular Rate:  86 PR Interval:  180 QRS Duration:  100 QT Interval:  382 QTC Calculation: 457 R Axis:   64  Text Interpretation: Normal sinus rhythm T wave abnormality, consider anterolateral ischemia Abnormal ECG When compared with ECG of 08-Jan-2024 10:44, T wave inversion now evident in Anterolateral leads new changes since last ekg Confirmed by Dean Clarity [phone]) on 09/17/2024 1:27:54 PM  Radiology: [facility] Chest 2 View Result Date: 09/17/2024 CLINICAL DATA:  Palpitations. EXAM: CHEST - 2 VIEW COMPARISON:  01/05/2024 FINDINGS: The heart  size and mediastinal contours are within normal limits. Stable mild scarring seen in the left upper and right lower lobes. Both lungs are otherwise clear. Thoracic spine degenerative disc disease noted. IMPRESSION: No active cardiopulmonary disease. Electronically Signed   By: Norleen DELENA Kil M.D.   On: 09/17/2024 13:24  Procedures   Medications Ordered in the ED - No data to display                                  Medical Decision Making Amount and/or Complexity of Data Reviewed Labs: ordered. Radiology: ordered.   This patient presents to the ED for concern of EKG changes, this involves an extensive number of treatment options, and is a complaint that carries with it a high risk of complications and morbidity.  The differential diagnosis includes cad, mi,    Co morbidities that complicate the patient evaluation  hepatitis C, PTSD, CAD, and hx DVT (no thinners)   Additional history obtained:  Additional history obtained from epic chart review    Lab Tests:  I Ordered, and personally interpreted labs.  The pertinent results include:  cbc nl, bmp nl other than cr sl elevated at 1.26 (0.78 in April); trop nl   Imaging Studies ordered:  I ordered imaging studies including cxr  I independently visualized and interpreted imaging which showed No active cardiopulmonary disease.  I agree with the radiologist interpretation  Medicines ordered and prescription drug management:   I have reviewed the patients home medicines and have made adjustments as needed  Problem List / ED Course:  EKG changes:  pt did not have cp yesterday and feels fine today.  Troponin nl.  No need for further emergent work up.  He is stable for d/c.  F/u with cards.   Reevaluation:  After the interventions noted above, I reevaluated the patient and found that they have :improved   Social Determinants of Health:  Lives at home   Dispostion:  After consideration of the diagnostic results and  the patients response to treatment, I feel that the patent would benefit from discharge with outpatient f/u.       Final diagnoses:  EKG abnormalities    ED Discharge Orders          Ordered    Ambulatory referral to Cardiology        09/17/24 1338               Dean Clarity, MD 09/17/24 1410
